# Patient Record
Sex: Female | Born: 1945
Health system: Southern US, Community
[De-identification: ages and names within clinical notes are randomized; demographics above are authoritative.]

## PROBLEM LIST (undated history)

## (undated) DIAGNOSIS — I70208 Unspecified atherosclerosis of native arteries of extremities, other extremity: Secondary | ICD-10-CM

## (undated) DIAGNOSIS — M1990 Unspecified osteoarthritis, unspecified site: Secondary | ICD-10-CM

## (undated) DIAGNOSIS — I341 Nonrheumatic mitral (valve) prolapse: Secondary | ICD-10-CM

## (undated) HISTORY — PX: COLONOSCOPY: SHX174

## (undated) HISTORY — PX: LIPOSUCTION HEAD / NECK: SUR831

## (undated) HISTORY — PX: ROTATOR CUFF REPAIR: SHX139

---

## 1998-03-03 ENCOUNTER — Other Ambulatory Visit: Admission: RE | Admit: 1998-03-03 | Discharge: 1998-03-03 | Payer: Self-pay | Admitting: Obstetrics and Gynecology

## 1999-03-03 ENCOUNTER — Other Ambulatory Visit: Admission: RE | Admit: 1999-03-03 | Discharge: 1999-03-03 | Payer: Self-pay | Admitting: Obstetrics and Gynecology

## 2000-02-24 ENCOUNTER — Encounter: Admission: RE | Admit: 2000-02-24 | Discharge: 2000-02-24 | Payer: Self-pay | Admitting: Obstetrics and Gynecology

## 2000-02-24 ENCOUNTER — Encounter: Payer: Self-pay | Admitting: Obstetrics and Gynecology

## 2000-03-10 ENCOUNTER — Other Ambulatory Visit: Admission: RE | Admit: 2000-03-10 | Discharge: 2000-03-10 | Payer: Self-pay | Admitting: Obstetrics and Gynecology

## 2000-03-11 ENCOUNTER — Other Ambulatory Visit: Admission: RE | Admit: 2000-03-11 | Discharge: 2000-03-11 | Payer: Self-pay | Admitting: Obstetrics and Gynecology

## 2001-02-15 ENCOUNTER — Ambulatory Visit (HOSPITAL_COMMUNITY): Admission: RE | Admit: 2001-02-15 | Discharge: 2001-02-15 | Payer: Self-pay | Admitting: Gastroenterology

## 2001-03-22 ENCOUNTER — Other Ambulatory Visit: Admission: RE | Admit: 2001-03-22 | Discharge: 2001-03-22 | Payer: Self-pay | Admitting: Obstetrics and Gynecology

## 2002-04-16 ENCOUNTER — Other Ambulatory Visit: Admission: RE | Admit: 2002-04-16 | Discharge: 2002-04-16 | Payer: Self-pay | Admitting: Obstetrics and Gynecology

## 2003-04-22 ENCOUNTER — Other Ambulatory Visit: Admission: RE | Admit: 2003-04-22 | Discharge: 2003-04-22 | Payer: Self-pay | Admitting: Obstetrics and Gynecology

## 2014-07-11 DIAGNOSIS — Z86010 Personal history of colonic polyps: Secondary | ICD-10-CM | POA: Diagnosis not present

## 2014-07-11 DIAGNOSIS — K5730 Diverticulosis of large intestine without perforation or abscess without bleeding: Secondary | ICD-10-CM | POA: Diagnosis not present

## 2014-07-16 DIAGNOSIS — M8580 Other specified disorders of bone density and structure, unspecified site: Secondary | ICD-10-CM | POA: Diagnosis not present

## 2014-07-16 DIAGNOSIS — Z01419 Encounter for gynecological examination (general) (routine) without abnormal findings: Secondary | ICD-10-CM | POA: Diagnosis not present

## 2014-07-16 DIAGNOSIS — Z124 Encounter for screening for malignant neoplasm of cervix: Secondary | ICD-10-CM | POA: Diagnosis not present

## 2014-07-16 DIAGNOSIS — G4700 Insomnia, unspecified: Secondary | ICD-10-CM | POA: Diagnosis not present

## 2014-07-16 DIAGNOSIS — N952 Postmenopausal atrophic vaginitis: Secondary | ICD-10-CM | POA: Diagnosis not present

## 2014-07-30 DIAGNOSIS — Z1231 Encounter for screening mammogram for malignant neoplasm of breast: Secondary | ICD-10-CM | POA: Diagnosis not present

## 2014-08-15 DIAGNOSIS — M8580 Other specified disorders of bone density and structure, unspecified site: Secondary | ICD-10-CM | POA: Diagnosis not present

## 2014-10-01 DIAGNOSIS — H01001 Unspecified blepharitis right upper eyelid: Secondary | ICD-10-CM | POA: Diagnosis not present

## 2014-10-01 DIAGNOSIS — H43813 Vitreous degeneration, bilateral: Secondary | ICD-10-CM | POA: Diagnosis not present

## 2014-10-01 DIAGNOSIS — H524 Presbyopia: Secondary | ICD-10-CM | POA: Diagnosis not present

## 2014-10-01 DIAGNOSIS — H5201 Hypermetropia, right eye: Secondary | ICD-10-CM | POA: Diagnosis not present

## 2014-12-08 DIAGNOSIS — S93402A Sprain of unspecified ligament of left ankle, initial encounter: Secondary | ICD-10-CM | POA: Diagnosis not present

## 2014-12-08 DIAGNOSIS — W19XXXS Unspecified fall, sequela: Secondary | ICD-10-CM | POA: Diagnosis not present

## 2014-12-08 DIAGNOSIS — R030 Elevated blood-pressure reading, without diagnosis of hypertension: Secondary | ICD-10-CM | POA: Diagnosis not present

## 2014-12-08 DIAGNOSIS — S0081XA Abrasion of other part of head, initial encounter: Secondary | ICD-10-CM | POA: Diagnosis not present

## 2015-01-28 DIAGNOSIS — Z23 Encounter for immunization: Secondary | ICD-10-CM | POA: Diagnosis not present

## 2015-01-28 DIAGNOSIS — I10 Essential (primary) hypertension: Secondary | ICD-10-CM | POA: Diagnosis not present

## 2015-04-17 DIAGNOSIS — L812 Freckles: Secondary | ICD-10-CM | POA: Diagnosis not present

## 2015-04-17 DIAGNOSIS — L919 Hypertrophic disorder of the skin, unspecified: Secondary | ICD-10-CM | POA: Diagnosis not present

## 2015-04-17 DIAGNOSIS — D1801 Hemangioma of skin and subcutaneous tissue: Secondary | ICD-10-CM | POA: Diagnosis not present

## 2015-07-29 DIAGNOSIS — Z124 Encounter for screening for malignant neoplasm of cervix: Secondary | ICD-10-CM | POA: Diagnosis not present

## 2015-07-29 DIAGNOSIS — Z01419 Encounter for gynecological examination (general) (routine) without abnormal findings: Secondary | ICD-10-CM | POA: Diagnosis not present

## 2015-07-29 DIAGNOSIS — Z113 Encounter for screening for infections with a predominantly sexual mode of transmission: Secondary | ICD-10-CM | POA: Diagnosis not present

## 2015-08-12 DIAGNOSIS — Z1231 Encounter for screening mammogram for malignant neoplasm of breast: Secondary | ICD-10-CM | POA: Diagnosis not present

## 2015-08-26 DIAGNOSIS — N63 Unspecified lump in breast: Secondary | ICD-10-CM | POA: Diagnosis not present

## 2015-08-26 DIAGNOSIS — R928 Other abnormal and inconclusive findings on diagnostic imaging of breast: Secondary | ICD-10-CM | POA: Diagnosis not present

## 2015-08-26 DIAGNOSIS — R922 Inconclusive mammogram: Secondary | ICD-10-CM | POA: Diagnosis not present

## 2015-11-25 DIAGNOSIS — M160 Bilateral primary osteoarthritis of hip: Secondary | ICD-10-CM | POA: Insufficient documentation

## 2015-12-26 LAB — CBC AND DIFFERENTIAL
HCT: 43 % (ref 36–46)
HEMOGLOBIN: 14.5 g/dL (ref 12.0–16.0)
Neutrophils Absolute: 45 /uL
Platelets: 328 10*3/uL (ref 150–399)
WBC: 6.3 10*3/mL

## 2015-12-26 LAB — BASIC METABOLIC PANEL
BUN: 21 mg/dL (ref 4–21)
CREATININE: 0.7 mg/dL (ref 0.5–1.1)
Glucose: 88 mg/dL
POTASSIUM: 4.8 mmol/L (ref 3.4–5.3)
SODIUM: 144 mmol/L (ref 137–147)

## 2015-12-26 LAB — HEPATIC FUNCTION PANEL
ALT: 39 U/L — AB (ref 7–35)
AST: 22 U/L (ref 13–35)
Alkaline Phosphatase: 96 U/L (ref 25–125)
Bilirubin, Total: 0.6 mg/dL

## 2015-12-26 LAB — LIPID PANEL
CHOLESTEROL: 3 mg/dL (ref 0–200)
HDL: 75 mg/dL — AB (ref 35–70)
LDL Cholesterol: 123 mg/dL
Triglycerides: 67 mg/dL (ref 40–160)

## 2015-12-31 DIAGNOSIS — Z23 Encounter for immunization: Secondary | ICD-10-CM | POA: Diagnosis not present

## 2016-02-26 DIAGNOSIS — N6489 Other specified disorders of breast: Secondary | ICD-10-CM | POA: Diagnosis not present

## 2016-03-13 ENCOUNTER — Encounter: Payer: Self-pay | Admitting: Internal Medicine

## 2016-03-13 DIAGNOSIS — M8580 Other specified disorders of bone density and structure, unspecified site: Secondary | ICD-10-CM | POA: Insufficient documentation

## 2016-03-13 DIAGNOSIS — M810 Age-related osteoporosis without current pathological fracture: Secondary | ICD-10-CM | POA: Insufficient documentation

## 2016-03-13 DIAGNOSIS — F418 Other specified anxiety disorders: Secondary | ICD-10-CM | POA: Insufficient documentation

## 2016-03-13 DIAGNOSIS — M19019 Primary osteoarthritis, unspecified shoulder: Secondary | ICD-10-CM | POA: Insufficient documentation

## 2016-03-13 DIAGNOSIS — R030 Elevated blood-pressure reading, without diagnosis of hypertension: Secondary | ICD-10-CM | POA: Insufficient documentation

## 2016-04-22 NOTE — Progress Notes (Signed)
Subjective:    Patient ID: Kelsey Dominguez, female    DOB: 1945/07/25, 70 y.o.   MRN: [id number]  HPI She is here to establish with a new pcp.    Tingling and occ numbness in left arm. It started a few months ago and it is intermittent.  She notices it mostly when she is in bed, but she thinks it occurs at other times as well.  She denies neck pain and pain in the left arm.  She denies weakness in the arm.   Her BP has been up and down in the past few years.  She has a diagnosis of htn, but is not on any medication.    Performance anxiety:  She takes propranolol on occasion for performance anxiety when she goes to a meeting.  The medication works well and she denies side effects.  She takes it rarely.   Osteopenia:  She thinks her dexa is up to date.  She is taking vitamin d daily.  She tries to get calcium in her diet.    Medications and allergies reviewed with patient and updated if appropriate.  Patient Active Problem List   Diagnosis Date Noted  . AC joint arthropathy 03/13/2016  . Osteopenia 03/13/2016  . Performance anxiety 03/13/2016  . Essential hypertension, benign 03/13/2016    No current outpatient prescriptions on file prior to visit.   No current facility-administered medications on file prior to visit.     History reviewed. No pertinent past medical history.  Past Surgical History:  Procedure Laterality Date  . LIPOSUCTION HEAD / NECK     chin and neck    Social History   Social History  . Marital status: Married    Spouse name: N/A  . Number of children: N/A  . Years of education: N/A   Social History Main Topics  . Smoking status: Former Research scientist (life sciences)  . Smokeless tobacco: Never Used  . Alcohol use Yes  . Drug use: No  . Sexual activity: Not Asked   Other Topics Concern  . None   Social History Narrative  . None    Family History  Problem Relation Age of Onset  . Cancer Mother   . Alzheimer's disease Father     Review of Systems    Constitutional: Negative for chills and fever.  Eyes: Negative for visual disturbance.  Respiratory: Negative for cough, shortness of breath and wheezing.   Cardiovascular: Positive for chest pain (episode of nausea in chest while driving - lasted seconds). Negative for palpitations and leg swelling.  Gastrointestinal: Negative for abdominal pain, blood in stool, constipation, diarrhea and nausea.       No gerd  Genitourinary: Negative for dysuria and hematuria.  Musculoskeletal: Positive for arthralgias (hips, right shoulder).  Neurological: Positive for dizziness (rare), numbness and headaches (rare). Negative for weakness and light-headedness.  Psychiatric/Behavioral: Negative for dysphoric mood. The patient is not nervous/anxious.        Objective:   Vitals:   04/23/16 1019  BP: 134/82  Pulse: 67  Resp: 16  Temp: 97.8 F (36.6 C)   Filed Weights   04/23/16 1019  Weight: 139 lb (63 kg)   Body mass index is 25.42 kg/m.   Physical Exam Constitutional: She appears well-developed and well-nourished. No distress.  HENT:  Head: Normocephalic and atraumatic.  Right Ear: External ear normal. Normal ear canal and TM Left Ear: External ear normal.  Normal ear canal and TM Mouth/Throat: Oropharynx is clear and moist.  Eyes: Conjunctivae and EOM are normal.  Neck: Neck supple. No tracheal deviation present. No thyromegaly present.  No carotid bruit  Cardiovascular: Normal rate, regular rhythm and normal heart sounds.   No murmur heard.  No edema. Pulmonary/Chest: Effort normal and breath sounds normal. No respiratory distress. She has no wheezes. She has no rales.  Breast: deferred Abdominal: Soft. She exhibits no distension. There is no tenderness.  Lymphadenopathy: She has no cervical adenopathy.  Skin: Skin is warm and dry. She is not diaphoretic.  Psychiatric: She has a normal mood and affect. Her behavior is normal.       Assessment & Plan:   See Problem List for  Assessment and Plan of chronic medical problems.

## 2016-04-23 ENCOUNTER — Encounter: Payer: Self-pay | Admitting: Internal Medicine

## 2016-04-23 ENCOUNTER — Ambulatory Visit (INDEPENDENT_AMBULATORY_CARE_PROVIDER_SITE_OTHER): Payer: Medicare Other | Admitting: Internal Medicine

## 2016-04-23 VITALS — BP 134/82 | HR 67 | Temp 97.8°F | Resp 16 | Ht 62.0 in | Wt 139.0 lb

## 2016-04-23 DIAGNOSIS — M8580 Other specified disorders of bone density and structure, unspecified site: Secondary | ICD-10-CM | POA: Diagnosis not present

## 2016-04-23 DIAGNOSIS — R202 Paresthesia of skin: Secondary | ICD-10-CM

## 2016-04-23 DIAGNOSIS — F418 Other specified anxiety disorders: Secondary | ICD-10-CM

## 2016-04-23 DIAGNOSIS — Z23 Encounter for immunization: Secondary | ICD-10-CM | POA: Diagnosis not present

## 2016-04-23 DIAGNOSIS — R200 Anesthesia of skin: Secondary | ICD-10-CM

## 2016-04-23 DIAGNOSIS — I10 Essential (primary) hypertension: Secondary | ICD-10-CM | POA: Diagnosis not present

## 2016-04-23 DIAGNOSIS — R110 Nausea: Secondary | ICD-10-CM

## 2016-04-23 MED ORDER — PROPRANOLOL HCL 20 MG PO TABS: 20.0000 mg | ORAL_TABLET | Freq: Two times a day (BID) | ORAL | 3 refills | Status: DC | PRN

## 2016-04-23 NOTE — Progress Notes (Signed)
Pre visit review using our clinic review tool, if applicable. No additional management support is needed unless otherwise documented below in the visit note. 

## 2016-04-23 NOTE — Patient Instructions (Addendum)
Monitor your BP at home - call or return if it is elevated.  If your tingling/numbness your left arm worsens please call or return.   If you experience any other chest symptoms please call.   All other Health Maintenance issues reviewed.   All recommended immunizations and age-appropriate screenings are up-to-date or discussed.  Pneumovax immunization administered today.   Medications reviewed and updated.   No changes recommended at this time.   Please followup once a year

## 2016-04-25 ENCOUNTER — Encounter: Payer: Self-pay | Admitting: Internal Medicine

## 2016-04-25 DIAGNOSIS — R110 Nausea: Secondary | ICD-10-CM | POA: Insufficient documentation

## 2016-04-25 DIAGNOSIS — R202 Paresthesia of skin: Secondary | ICD-10-CM

## 2016-04-25 DIAGNOSIS — R200 Anesthesia of skin: Secondary | ICD-10-CM | POA: Insufficient documentation

## 2016-04-25 NOTE — Assessment & Plan Note (Signed)
She had an episode of a "nausea" like feeling in her chest while driving last week No discomfort of pain No discomfort or pain with activity/exercise The sensation lasted seconds, no recurrence Hard to say what it  Was - does not sound cardiac in nature She agrees to return if she has any other concerning symptoms

## 2016-04-25 NOTE — Assessment & Plan Note (Signed)
?   dexa up to date - will revisit at her next visit Continue vitamin D If not able to get enough calcium in diet - supplement Regular exercise stressed

## 2016-04-25 NOTE — Assessment & Plan Note (Signed)
Continue propranolol as needed.

## 2016-04-25 NOTE — Assessment & Plan Note (Signed)
Intermittent Most noticeable when laying down ? Cervical radiculopathy No neck pain, weakness or pain Discussed further evaluation at this time vs observation -- she will just monitor for now and if worsens she will call/return

## 2016-04-25 NOTE — Assessment & Plan Note (Signed)
Controlled without medication at this time Advised her to monitor BP at home regularly Low sodium diet Regular exercise Follow up if BP is elevated at home Deferred blood work to next visit

## 2016-04-29 ENCOUNTER — Encounter: Payer: Self-pay | Admitting: Internal Medicine

## 2016-06-25 DIAGNOSIS — H524 Presbyopia: Secondary | ICD-10-CM | POA: Diagnosis not present

## 2016-06-25 DIAGNOSIS — H01004 Unspecified blepharitis left upper eyelid: Secondary | ICD-10-CM | POA: Diagnosis not present

## 2016-06-25 DIAGNOSIS — H01001 Unspecified blepharitis right upper eyelid: Secondary | ICD-10-CM | POA: Diagnosis not present

## 2016-06-25 DIAGNOSIS — H2513 Age-related nuclear cataract, bilateral: Secondary | ICD-10-CM | POA: Diagnosis not present

## 2016-08-03 DIAGNOSIS — Z124 Encounter for screening for malignant neoplasm of cervix: Secondary | ICD-10-CM | POA: Diagnosis not present

## 2016-08-17 DIAGNOSIS — M8589 Other specified disorders of bone density and structure, multiple sites: Secondary | ICD-10-CM | POA: Diagnosis not present

## 2016-08-17 DIAGNOSIS — Z1231 Encounter for screening mammogram for malignant neoplasm of breast: Secondary | ICD-10-CM | POA: Diagnosis not present

## 2016-09-14 DIAGNOSIS — D1800 Hemangioma unspecified site: Secondary | ICD-10-CM | POA: Diagnosis not present

## 2016-09-14 DIAGNOSIS — L570 Actinic keratosis: Secondary | ICD-10-CM | POA: Diagnosis not present

## 2016-09-14 DIAGNOSIS — L821 Other seborrheic keratosis: Secondary | ICD-10-CM | POA: Diagnosis not present

## 2016-09-14 DIAGNOSIS — L814 Other melanin hyperpigmentation: Secondary | ICD-10-CM | POA: Diagnosis not present

## 2016-09-14 DIAGNOSIS — D225 Melanocytic nevi of trunk: Secondary | ICD-10-CM | POA: Diagnosis not present

## 2017-02-17 DIAGNOSIS — Z23 Encounter for immunization: Secondary | ICD-10-CM | POA: Diagnosis not present

## 2017-06-05 NOTE — Patient Instructions (Addendum)
Kelsey Dominguez , Thank you for taking time to come for your Medicare Wellness Visit. I appreciate your ongoing commitment to your health goals. Please review the following plan we discussed and let me know if I can assist you in the future.   These are the goals we discussed: Goals    Monitor your BP - it should be less than 140/90      This is a list of the screening recommended for you and due dates:  Health Maintenance  Topic Date Due  .  Hepatitis C: One time screening is recommended by Center for Disease Control  (CDC) for  adults born from [birth year] through 1963.   [birth year]  . Mammogram  06/28/1997  . Colon Cancer Screening  06/28/1997  . DEXA scan (bone density measurement)  06/28/2012  . Tetanus Vaccine  07/31/2017  . Flu Shot  Completed  . Pneumonia vaccines  Completed      Test(s) ordered today. Your results will be released to [facility] (or called to you) after review, usually within 72hours after test completion. If any changes need to be made, you will be notified at that same time.  All other Health Maintenance issues reviewed.   All recommended immunizations and age-appropriate screenings are up-to-date or discussed.  No immunizations administered today.   Medications reviewed and updated.  No changes recommended at this time.   Please followup in one year    Health Maintenance, Female Adopting a healthy lifestyle and getting preventive care can go a long way to promote health and wellness. Talk with your health care provider about what schedule of regular examinations is right for you. This is a good chance for you to check in with your provider about disease prevention and staying healthy. In between checkups, there are plenty of things you can do on your own. Experts have done a lot of research about which lifestyle changes and preventive measures are most likely to keep you healthy. Ask your health care provider for more information. Weight and diet Eat a healthy  diet  Be sure to include plenty of vegetables, fruits, low-fat dairy products, and lean protein.  Do not eat a lot of foods high in solid fats, added sugars, or salt.  Get regular exercise. This is one of the most important things you can do for your health. ? Most adults should exercise for at least 150 minutes each week. The exercise should increase your heart rate and make you sweat (moderate-intensity exercise). ? Most adults should also do strengthening exercises at least twice a week. This is in addition to the moderate-intensity exercise.  Maintain a healthy weight  Body mass index (BMI) is a measurement that can be used to identify possible weight problems. It estimates body fat based on height and weight. Your health care provider can help determine your BMI and help you achieve or maintain a healthy weight.  For females 64 years of age and older: ? A BMI below 18.5 is considered underweight. ? A BMI of 18.5 to 24.9 is normal. ? A BMI of 25 to 29.9 is considered overweight. ? A BMI of 30 and above is considered obese.  Watch levels of cholesterol and blood lipids  You should start having your blood tested for lipids and cholesterol at 70 years of age, then have this test every 5 years.  You may need to have your cholesterol levels checked more often if: ? Your lipid or cholesterol levels are high. ? You are older  than 71 years of age. ? You are at high risk for heart disease.  Cancer screening Lung Cancer  Lung cancer screening is recommended for adults 72-55 years old who are at high risk for lung cancer because of a history of smoking.  A yearly low-dose CT scan of the lungs is recommended for people who: ? Currently smoke. ? Have quit within the past 15 years. ? Have at least a 30-pack-year history of smoking. A pack year is smoking an average of one pack of cigarettes a day for 1 year.  Yearly screening should continue until it has been 15 years since you  quit.  Yearly screening should stop if you develop a health problem that would prevent you from having lung cancer treatment.  Breast Cancer  Practice breast self-awareness. This means understanding how your breasts normally appear and feel.  It also means doing regular breast self-exams. Let your health care provider know about any changes, no matter how small.  If you are in your 20s or 30s, you should have a clinical breast exam (CBE) by a health care provider every 1-3 years as part of a regular health exam.  If you are 55 or older, have a CBE every year. Also consider having a breast X-ray (mammogram) every year.  If you have a family history of breast cancer, talk to your health care provider about genetic screening.  If you are at high risk for breast cancer, talk to your health care provider about having an MRI and a mammogram every year.  Breast cancer gene (BRCA) assessment is recommended for women who have family members with BRCA-related cancers. BRCA-related cancers include: ? Breast. ? Ovarian. ? Tubal. ? Peritoneal cancers.  Results of the assessment will determine the need for genetic counseling and BRCA1 and BRCA2 testing.  Cervical Cancer Your health care provider may recommend that you be screened regularly for cancer of the pelvic organs (ovaries, uterus, and vagina). This screening involves a pelvic examination, including checking for microscopic changes to the surface of your cervix (Pap test). You may be encouraged to have this screening done every 3 years, beginning at age 34.  For women ages 59-65, health care providers may recommend pelvic exams and Pap testing every 3 years, or they may recommend the Pap and pelvic exam, combined with testing for human papilloma virus (HPV), every 5 years. Some types of HPV increase your risk of cervical cancer. Testing for HPV may also be done on women of any age with unclear Pap test results.  Other health care providers  may not recommend any screening for nonpregnant women who are considered low risk for pelvic cancer and who do not have symptoms. Ask your health care provider if a screening pelvic exam is right for you.  If you have had past treatment for cervical cancer or a condition that could lead to cancer, you need Pap tests and screening for cancer for at least 20 years after your treatment. If Pap tests have been discontinued, your risk factors (such as having a new sexual partner) need to be reassessed to determine if screening should resume. Some women have medical problems that increase the chance of getting cervical cancer. In these cases, your health care provider may recommend more frequent screening and Pap tests.  Colorectal Cancer  This type of cancer can be detected and often prevented.  Routine colorectal cancer screening usually begins at 70 years of age and continues through 70 years of age.  Your  health care provider may recommend screening at an earlier age if you have risk factors for colon cancer.  Your health care provider may also recommend using home test kits to check for hidden blood in the stool.  A small camera at the end of a tube can be used to examine your colon directly (sigmoidoscopy or colonoscopy). This is done to check for the earliest forms of colorectal cancer.  Routine screening usually begins at age 73.  Direct examination of the colon should be repeated every 5-10 years through 70 years of age. However, you may need to be screened more often if early forms of precancerous polyps or small growths are found.  Skin Cancer  Check your skin from head to toe regularly.  Tell your health care provider about any new moles or changes in moles, especially if there is a change in a mole's shape or color.  Also tell your health care provider if you have a mole that is larger than the size of a pencil eraser.  Always use sunscreen. Apply sunscreen liberally and repeatedly  throughout the day.  Protect yourself by wearing long sleeves, pants, a wide-brimmed hat, and sunglasses whenever you are outside.  Heart disease, diabetes, and high blood pressure  High blood pressure causes heart disease and increases the risk of stroke. High blood pressure is more likely to develop in: ? People who have blood pressure in the high end of the normal range (130-139/85-89 mm Hg). ? People who are overweight or obese. ? People who are African American.  If you are 44-39 years of age, have your blood pressure checked every 3-5 years. If you are 44 years of age or older, have your blood pressure checked every year. You should have your blood pressure measured twice-once when you are at a hospital or clinic, and once when you are not at a hospital or clinic. Record the average of the two measurements. To check your blood pressure when you are not at a hospital or clinic, you can use: ? An automated blood pressure machine at a pharmacy. ? A home blood pressure monitor.  If you are between 31 years and 80 years old, ask your health care provider if you should take aspirin to prevent strokes.  Have regular diabetes screenings. This involves taking a blood sample to check your fasting blood sugar level. ? If you are at a normal weight and have a low risk for diabetes, have this test once every three years after 70 years of age. ? If you are overweight and have a high risk for diabetes, consider being tested at a younger age or more often. Preventing infection Hepatitis B  If you have a higher risk for hepatitis B, you should be screened for this virus. You are considered at high risk for hepatitis B if: ? You were born in a country where hepatitis B is common. Ask your health care provider which countries are considered high risk. ? Your parents were born in a high-risk country, and you have not been immunized against hepatitis B (hepatitis B vaccine). ? You have HIV or AIDS. ? You  use needles to inject street drugs. ? You live with someone who has hepatitis B. ? You have had sex with someone who has hepatitis B. ? You get hemodialysis treatment. ? You take certain medicines for conditions, including cancer, organ transplantation, and autoimmune conditions.  Hepatitis C  Blood testing is recommended for: ? Everyone born from [birth year] through 1963. ?  Anyone with known risk factors for hepatitis C.  Sexually transmitted infections (STIs)  You should be screened for sexually transmitted infections (STIs) including gonorrhea and chlamydia if: ? You are sexually active and are younger than 70 years of age. ? You are older than 70 years of age and your health care provider tells you that you are at risk for this type of infection. ? Your sexual activity has changed since you were last screened and you are at an increased risk for chlamydia or gonorrhea. Ask your health care provider if you are at risk.  If you do not have HIV, but are at risk, it may be recommended that you take a prescription medicine daily to prevent HIV infection. This is called pre-exposure prophylaxis (PrEP). You are considered at risk if: ? You are sexually active and do not regularly use condoms or know the HIV status of your partner(s). ? You take drugs by injection. ? You are sexually active with a partner who has HIV.  Talk with your health care provider about whether you are at high risk of being infected with HIV. If you choose to begin PrEP, you should first be tested for HIV. You should then be tested every 3 months for as long as you are taking PrEP. Pregnancy  If you are premenopausal and you may become pregnant, ask your health care provider about preconception counseling.  If you may become pregnant, take 400 to 800 micrograms (mcg) of folic acid every day.  If you want to prevent pregnancy, talk to your health care provider about birth control (contraception). Osteoporosis and  menopause  Osteoporosis is a disease in which the bones lose minerals and strength with aging. This can result in serious bone fractures. Your risk for osteoporosis can be identified using a bone density scan.  If you are 58 years of age or older, or if you are at risk for osteoporosis and fractures, ask your health care provider if you should be screened.  Ask your health care provider whether you should take a calcium or vitamin D supplement to lower your risk for osteoporosis.  Menopause may have certain physical symptoms and risks.  Hormone replacement therapy may reduce some of these symptoms and risks. Talk to your health care provider about whether hormone replacement therapy is right for you. Follow these instructions at home:  Schedule regular health, dental, and eye exams.  Stay current with your immunizations.  Do not use any tobacco products including cigarettes, chewing tobacco, or electronic cigarettes.  If you are pregnant, do not drink alcohol.  If you are breastfeeding, limit how much and how often you drink alcohol.  Limit alcohol intake to no more than 1 drink per day for nonpregnant women. One drink equals 12 ounces of beer, 5 ounces of wine, or 1 ounces of hard liquor.  Do not use street drugs.  Do not share needles.  Ask your health care provider for help if you need support or information about quitting drugs.  Tell your health care provider if you often feel depressed.  Tell your health care provider if you have ever been abused or do not feel safe at home. This information is not intended to replace advice given to you by your health care provider. Make sure you discuss any questions you have with your health care provider. Document Released: 11/09/2010 Document Revised: 10/02/2015 Document Reviewed: 01/28/2015 Elsevier Interactive Patient Education  [employer].

## 2017-06-05 NOTE — Progress Notes (Signed)
Subjective:    Patient ID: Kelsey Dominguez, female    DOB: 07-09-1945, 72 y.o.   MRN: [id number]  HPI Here for medicare wellness exam and follow up of her chronic medical problems.     I have personally reviewed and have noted 1.The patient's medical and social history 2.Their use of alcohol, tobacco or illicit drugs 3.Their current medications and supplements 4.The patient's functional ability including ADL's, fall risks, home                 safety risk and hearing or visual impairment. 5.Diet and physical activities 6.Evidence for depression or mood disorders 7.Care team reviewed  -  [facility] - Dr Earlean Shawl, Gyn,   [place] hypertension: She is not on any medication.  She is compliant with a low sodium diet.  She denies chest pain, palpitations, edema, shortness of breath and regular headaches. She is exercising regularly.  She does not monitor her blood pressure at home now, but has checked it in the past and it was always good.     Numbness in hands:  She has numbness in her hands during the night.  If she moves it goes away.  She denies any numbness/weakness or pain during the day.    Performance anxiety:  She takes propranolol as needed for anxiety when presenting.  It works well.  She rarely takes it.   Are there smokers in your home (other than you)? No  Risk Factors Exercise:   Walking, working with a trainer Dietary issues discussed:  Well balanced, mostly at home  Vitamin and supplement use: taking vitamin irregularly  Opiod use:  no Side effects from medication: n/a Does medications benefits outweigh risks/side effects: n/a  Cardiac risk factors: advanced age, hypertension  Depression Screen  Have you felt down, depressed or hopeless? No  Have you felt little interest or pleasure in doing things?  No  Activities of Daily Living In your present state of health, do you have any difficulty performing the  following activities?:  Driving? No Managing money?  No Feeding yourself? No Getting from bed to chair? No Climbing a flight of stairs? No Preparing food and eating?: No Bathing or showering? No Getting dressed: No Getting to/using the toilet? No Moving around from place to place: No In the past year have you fallen or had a near fall?: No   Are you sexually active?  No  Do you have more than one partner?  N/A  Hearing Difficulties: yes, mild Do you often ask people to speak up or repeat themselves? No Do you experience ringing or noises in your ears? No Do you have difficulty understanding soft or whispered voices? yes Vision:              Any change in vision: no             Up to date with eye exam: yes  Memory:  Do you feel that you have a problem with memory? Yes, recall, but also some short time memory concerns.  She has a family history and worries about memory loss  Do you often misplace items? No  Do you feel safe at home?  Yes  Cognitive Testing  Alert, Orientated? Yes  Normal Appearance? Yes  Recall of three objects?  Yes  Can perform simple calculations? Yes  Displays appropriate judgment? Yes  Can read the correct time from a watch face? Yes   Advanced Directives have been discussed with the patient? Yes -  in place      Medications and allergies reviewed with patient and updated if appropriate.  Patient Active Problem List   Diagnosis Date Noted  . Numbness and tingling in left arm 04/25/2016  . Nausea 04/25/2016  . AC joint arthropathy 03/13/2016  . Osteopenia 03/13/2016  . Performance anxiety 03/13/2016  . Essential hypertension, benign 03/13/2016  . Osteoarthritis, hip, bilateral 11/25/2015    Current Outpatient Medications on File Prior to Visit  Medication Sig Dispense Refill  . aspirin 81 MG tablet Take 81 mg by mouth daily.    . Cholecalciferol (VITAMIN D3) 2000 units TABS Take by mouth daily.    . Melatonin 5 MG TABS Take by mouth daily.     . Omega-3 Fatty Acids (FISH OIL) 1200 MG CAPS Take 2,400 mg by mouth daily.    . propranolol (INDERAL) 20 MG tablet Take 1 tablet (20 mg total) by mouth 2 (two) times daily as needed (performance anxiety). 30 tablet 3  . TURMERIC PO Take 1,000 mg by mouth daily.     No current facility-administered medications on file prior to visit.     No past medical history on file.  Past Surgical History:  Procedure Laterality Date  . LIPOSUCTION HEAD / NECK     chin and neck    Social History   Socioeconomic History  . Marital status: Married    Spouse name: None  . Number of children: None  . Years of education: None  . Highest education level: None  Social Needs  . Financial resource strain: None  . Food insecurity - worry: None  . Food insecurity - inability: None  . Transportation needs - medical: None  . Transportation needs - non-medical: None  Occupational History  . None  Tobacco Use  . Smoking status: Former Research scientist (life sciences)  . Smokeless tobacco: Never Used  Substance and Sexual Activity  . Alcohol use: Yes    Comment: daily - 1-2 wine per day  . Drug use: No  . Sexual activity: None  Other Topics Concern  . None  Social History Narrative   Walks 2-3 times a week    Family History  Problem Relation Age of Onset  . Lung cancer Mother   . Alzheimer's disease Father   . Heart disease Father   . Heart disease Brother        s/p stent  . Stroke Maternal Grandfather     Review of Systems  Constitutional: Negative for chills and fever.  Eyes: Negative for visual disturbance.  Respiratory: Negative for cough, shortness of breath and wheezing.   Cardiovascular: Negative for chest pain, palpitations and leg swelling.  Gastrointestinal: Negative for abdominal pain, blood in stool, constipation, diarrhea and nausea.       Rare gerd  Genitourinary: Negative for dysuria and hematuria.  Musculoskeletal: Positive for arthralgias (hip, shoulder).  Neurological: Negative for  light-headedness and headaches.  Psychiatric/Behavioral: Negative for dysphoric mood. The patient is not nervous/anxious.        Objective:   Vitals:   06/06/17 0912  BP: (!) 152/94  Pulse: 66  Resp: 16  Temp: 98.2 F (36.8 C)  SpO2: 98%   Filed Weights   06/06/17 0912  Weight: 136 lb (61.7 kg)   Body mass index is 24.87 kg/m.  Wt Readings from Last 3 Encounters:  06/06/17 136 lb (61.7 kg)  04/23/16 139 lb (63 kg)     Physical Exam Constitutional: She appears well-developed and well-nourished. No distress.  HENT:  Head: Normocephalic and atraumatic.  Right Ear: External ear normal. Normal ear canal and TM Left Ear: External ear normal.  Normal ear canal and TM Mouth/Throat: Oropharynx is clear and moist.  Eyes: Conjunctivae and EOM are normal.  Neck: Neck supple. No tracheal deviation present. No thyromegaly present.  No carotid bruit  Cardiovascular: Normal rate, regular rhythm and normal heart sounds.   No murmur heard.  No edema. Pulmonary/Chest: Effort normal and breath sounds normal. No respiratory distress. She has no wheezes. She has no rales.  Breast: deferred to Gyn Abdominal: Soft. She exhibits no distension. There is no tenderness.  Lymphadenopathy: She has no cervical adenopathy.  Skin: Skin is warm and dry. She is not diaphoretic.  Psychiatric: She has a normal mood and affect. Her behavior is normal.        Assessment & Plan:   Wellness Exam: Immunizations  Had shingrix, others up to date Colonoscopy  Up to date  Mammogram  Up to date - next one due soon - will have report sent - [facility]   Dexa  Up to date - done via gyn Gyn  Up to date  Eye exam   Up to date  Hearing loss  Minimal - not a concern at this point Memory concerns/difficulties  Some concern with short term memory and recall - will check tsh, b12  -- consider a referral to neurology Independent of ADLs   Fully   Stressed the importance of regular exercise  Routine blood work  reviewed from the summer - done elsewhere - normal  Patient received copy of preventative screening tests/immunizations recommended for the next 5-10 years.   See Problem List for Assessment and Plan of chronic medical problems.    FU annually

## 2017-06-06 ENCOUNTER — Encounter: Payer: Self-pay | Admitting: Internal Medicine

## 2017-06-06 ENCOUNTER — Ambulatory Visit (INDEPENDENT_AMBULATORY_CARE_PROVIDER_SITE_OTHER): Payer: Medicare Other | Admitting: Internal Medicine

## 2017-06-06 ENCOUNTER — Other Ambulatory Visit (INDEPENDENT_AMBULATORY_CARE_PROVIDER_SITE_OTHER): Payer: Medicare Other

## 2017-06-06 VITALS — BP 152/94 | HR 66 | Temp 98.2°F | Resp 16 | Ht 62.0 in | Wt 136.0 lb

## 2017-06-06 DIAGNOSIS — R030 Elevated blood-pressure reading, without diagnosis of hypertension: Secondary | ICD-10-CM

## 2017-06-06 DIAGNOSIS — M8580 Other specified disorders of bone density and structure, unspecified site: Secondary | ICD-10-CM | POA: Diagnosis not present

## 2017-06-06 DIAGNOSIS — Z1159 Encounter for screening for other viral diseases: Secondary | ICD-10-CM

## 2017-06-06 DIAGNOSIS — Z0000 Encounter for general adult medical examination without abnormal findings: Secondary | ICD-10-CM | POA: Diagnosis not present

## 2017-06-06 DIAGNOSIS — F418 Other specified anxiety disorders: Secondary | ICD-10-CM

## 2017-06-06 DIAGNOSIS — R413 Other amnesia: Secondary | ICD-10-CM | POA: Diagnosis not present

## 2017-06-06 LAB — TSH: TSH: 1.61 u[IU]/mL (ref 0.35–4.50)

## 2017-06-06 LAB — VITAMIN B12: VITAMIN B 12: 310 pg/mL (ref 211–911)

## 2017-06-06 NOTE — Assessment & Plan Note (Signed)
Some concerns over memory - she has family history ? Normal for aging or true memory difficulties Check tsh, b12 Stressed mental and physical exercise, healthy lifestyle Discussed referral to neuro-psych for evaluation of memory - she will think about it and let me know if she wants the referral

## 2017-06-06 NOTE — Assessment & Plan Note (Signed)
Takes propranolol prn Will continue

## 2017-06-06 NOTE — Assessment & Plan Note (Signed)
Has white coat htn At home BP has been controlled - has not checked recently --- advised checking it regularly for the next two weeks and then intermittently but needs to always be checking Continue healthy lifestyle No medication at this time

## 2017-06-06 NOTE — Assessment & Plan Note (Signed)
dexa done by gyn Taking vitamin d

## 2017-06-07 ENCOUNTER — Encounter: Payer: Self-pay | Admitting: Internal Medicine

## 2017-06-07 LAB — HEPATITIS C ANTIBODY
HEP C AB: NONREACTIVE
SIGNAL TO CUT-OFF: 0.01 (ref <1.00)

## 2017-08-04 DIAGNOSIS — Z01419 Encounter for gynecological examination (general) (routine) without abnormal findings: Secondary | ICD-10-CM | POA: Diagnosis not present

## 2017-08-04 DIAGNOSIS — Z124 Encounter for screening for malignant neoplasm of cervix: Secondary | ICD-10-CM | POA: Diagnosis not present

## 2017-08-16 DIAGNOSIS — L814 Other melanin hyperpigmentation: Secondary | ICD-10-CM | POA: Diagnosis not present

## 2017-08-16 DIAGNOSIS — L821 Other seborrheic keratosis: Secondary | ICD-10-CM | POA: Diagnosis not present

## 2017-08-16 DIAGNOSIS — D229 Melanocytic nevi, unspecified: Secondary | ICD-10-CM | POA: Diagnosis not present

## 2017-08-16 DIAGNOSIS — D1801 Hemangioma of skin and subcutaneous tissue: Secondary | ICD-10-CM | POA: Diagnosis not present

## 2017-08-16 DIAGNOSIS — L570 Actinic keratosis: Secondary | ICD-10-CM | POA: Diagnosis not present

## 2017-08-23 DIAGNOSIS — Z1231 Encounter for screening mammogram for malignant neoplasm of breast: Secondary | ICD-10-CM | POA: Diagnosis not present

## 2017-08-23 LAB — HM MAMMOGRAPHY

## 2017-10-04 DIAGNOSIS — H2513 Age-related nuclear cataract, bilateral: Secondary | ICD-10-CM | POA: Diagnosis not present

## 2017-10-04 DIAGNOSIS — H5203 Hypermetropia, bilateral: Secondary | ICD-10-CM | POA: Diagnosis not present

## 2017-10-04 DIAGNOSIS — H524 Presbyopia: Secondary | ICD-10-CM | POA: Diagnosis not present

## 2017-12-14 ENCOUNTER — Encounter: Payer: Self-pay | Admitting: Internal Medicine

## 2018-02-06 DIAGNOSIS — Z23 Encounter for immunization: Secondary | ICD-10-CM | POA: Diagnosis not present

## 2018-05-26 DIAGNOSIS — M25511 Pain in right shoulder: Secondary | ICD-10-CM | POA: Diagnosis not present

## 2018-05-26 DIAGNOSIS — M7541 Impingement syndrome of right shoulder: Secondary | ICD-10-CM | POA: Diagnosis not present

## 2018-06-09 DIAGNOSIS — M25511 Pain in right shoulder: Secondary | ICD-10-CM | POA: Diagnosis not present

## 2018-06-14 DIAGNOSIS — M25511 Pain in right shoulder: Secondary | ICD-10-CM | POA: Diagnosis not present

## 2018-06-16 DIAGNOSIS — M25511 Pain in right shoulder: Secondary | ICD-10-CM | POA: Diagnosis not present

## 2018-06-19 DIAGNOSIS — M25511 Pain in right shoulder: Secondary | ICD-10-CM | POA: Diagnosis not present

## 2018-06-19 NOTE — Progress Notes (Signed)
Subjective:    Patient ID: Kelsey Dominguez, female    DOB: 1946/02/06, 73 y.o.   MRN: [id number]  HPI Here for medicare wellness exam and follow up of her chronic medical problems.   I have personally reviewed and have noted 1.The patient's medical and social history 2.Their use of alcohol, tobacco or illicit drugs 3.Their current medications and supplements 4.The patient's functional ability including ADL's, fall risks, home                 safety risk and hearing or visual impairment. 5.Diet and physical activities 6.Evidence for depression or mood disorders 7.Care team reviewed  -  GI - Dr Earlean Shawl, Gyn    Anxiety: She has had intermittent anxiety in the past and was taking propranolol as needed mostly for performance anxiety, but has not taken this.  She does not feel it is necessary.   White coat htn:  She monitors her BP at home and it is well controlled at home.  Her BP is good here.  Overall she eats healthy and denies a diet high in sodium.  She is not currently exercising regularly.  Osteopenia: She is unsure when her last bone density was-most likely this was done by her gynecologist.  She is not currently exercising regularly.  She was taking vitamin D daily, but stopped it because her husband read something about it not being safe.  She is not currently taking any calcium.   Are there smokers in your home (other than you)? No  Risk Factors Exercise:  None regularly Dietary issues discussed:   Well balanced, fruits, veges  Vitamin and supplement use:  None currently - discussed   Opiod use:  n/a Side effects from medication: Does medications benefits outweigh risks/side effects:   Cardiac risk factors: advanced age  Depression Screen  Have you felt down, depressed or hopeless? No  Have you felt little interest or pleasure in doing things?  No  Activities of Daily Living In your present state of  health, do you have any difficulty performing the following activities?:  Driving? No Managing money?  No Feeding yourself? No Getting from bed to chair? No Climbing a flight of stairs? No Preparing food and eating?: No Bathing or showering? No Getting dressed: No Getting to/using the toilet? No Moving around from place to place: No In the past year have you fallen or had a near fall?: yes, trip   Do you have more than one partner?  No  Hearing Difficulties:  Do you often ask people to speak up or repeat themselves? No Do you experience ringing or noises in your ears? No Do you have difficulty understanding soft or whispered voices? yes Vision:              Any change in vision: no             Up to date with eye exam:   yes  Memory:  Do you feel that you have a problem with memory? No  Do you often misplace items? Not above normal  Does not get lost driving, but has to think about where is she  going.   Do you feel safe at home?  Yes  Cognitive Testing  Alert, Orientated? Yes  Normal Appearance? Yes  Recall of three objects?  Yes  Can perform simple calculations? Yes  Displays appropriate judgment? Yes  Can read the correct time from a watch face? Yes   Advanced Directives have been  discussed with the patient? Yes, in place   Medications and allergies reviewed with patient and updated if appropriate.  Patient Active Problem List   Diagnosis Date Noted  . Memory difficulties 06/06/2017  . Numbness and tingling in left arm 04/25/2016  . AC joint arthropathy 03/13/2016  . Osteopenia 03/13/2016  . Performance anxiety 03/13/2016  . White coat syndrome without diagnosis of hypertension 03/13/2016  . Osteoarthritis, hip, bilateral 11/25/2015    Current Outpatient Medications on File Prior to Visit  Medication Sig Dispense Refill  . Cholecalciferol (VITAMIN D3) 2000 units TABS Take by mouth daily.     No current facility-administered medications on file prior to  visit.     No past medical history on file.  Past Surgical History:  Procedure Laterality Date  . LIPOSUCTION HEAD / NECK     chin and neck    Social History   Socioeconomic History  . Marital status: Married    Spouse name: Not on file  . Number of children: Not on file  . Years of education: Not on file  . Highest education level: Not on file  Occupational History  . Not on file  Social Needs  . Financial resource strain: Not on file  . Food insecurity:    Worry: Not on file    Inability: Not on file  . Transportation needs:    Medical: Not on file    Non-medical: Not on file  Tobacco Use  . Smoking status: Former Research scientist (life sciences)  . Smokeless tobacco: Never Used  Substance and Sexual Activity  . Alcohol use: Yes    Comment: weekends only  . Drug use: No  . Sexual activity: Not on file  Lifestyle  . Physical activity:    Days per week: Not on file    Minutes per session: Not on file  . Stress: Not on file  Relationships  . Social connections:    Talks on phone: Not on file    Gets together: Not on file    Attends religious service: Not on file    Active member of club or organization: Not on file    Attends meetings of clubs or organizations: Not on file    Relationship status: Not on file  Other Topics Concern  . Not on file  Social History Narrative   Walks 2-3 times a week    Family History  Problem Relation Age of Onset  . Lung cancer Mother   . Alzheimer's disease Father   . Heart disease Father   . Heart disease Brother        s/p stent  . Stroke Maternal Grandfather     Review of Systems  Constitutional: Negative for chills and fever.  HENT: Positive for hearing loss (minimal). Negative for tinnitus.   Eyes: Negative for visual disturbance.  Respiratory: Negative for cough, shortness of breath and wheezing.   Cardiovascular: Negative for chest pain, palpitations and leg swelling.  Neurological: Negative for light-headedness and headaches.    Psychiatric/Behavioral: Negative for dysphoric mood. The patient is not nervous/anxious.        Objective:   Vitals:   06/20/18 1047  BP: 124/82  Pulse: 63  Temp: 98.3 F (36.8 C)  SpO2: 98%   BP Readings from Last 3 Encounters:  06/20/18 124/82  06/06/17 (!) 152/94  04/23/16 134/82   Wt Readings from Last 3 Encounters:  06/20/18 135 lb (61.2 kg)  06/06/17 136 lb (61.7 kg)  04/23/16 139 lb (63 kg)  Body mass index is 24.69 kg/m.   Physical Exam    Constitutional: Appears well-developed and well-nourished. No distress.  HENT:  Head: Normocephalic and atraumatic.  Neck: Neck supple. No tracheal deviation present. No thyromegaly present.  No cervical lymphadenopathy Cardiovascular: Normal rate, regular rhythm and normal heart sounds.   No murmur heard. left carotid bruit .  No edema Pulmonary/Chest: Effort normal and breath sounds normal. No respiratory distress. No has no wheezes. No rales. Abdomen: Soft, nontender, nondistended Skin: Skin is warm and dry. Not diaphoretic.  Psychiatric: Normal mood and affect. Behavior is normal.      Assessment & Plan:   Wellness Exam: Immunizations   Up to date except tdap Colonoscopy   Up to date  Mammogram due - goes to [facility]-we will get report Dexa  ? Up to date - she will check her Gyn Eye exam   Up to date  Hearing loss  Minimal if any-she is not concerned Memory concerns/difficulties-she does worry about memory issues because her father had Alzheimer's, but feels her memory is likely normal.  No concerning symptoms-we will just monitor annually Independent of ADLs  Fully independent Stressed the importance of regular exercise   Patient received copy of preventative screening tests/immunizations recommended for the next 5-10 years.    See Problem List for Assessment and Plan of chronic medical problems.

## 2018-06-20 ENCOUNTER — Ambulatory Visit (INDEPENDENT_AMBULATORY_CARE_PROVIDER_SITE_OTHER): Payer: Medicare Other | Admitting: Internal Medicine

## 2018-06-20 ENCOUNTER — Encounter: Payer: Self-pay | Admitting: Internal Medicine

## 2018-06-20 ENCOUNTER — Other Ambulatory Visit (INDEPENDENT_AMBULATORY_CARE_PROVIDER_SITE_OTHER): Payer: Medicare Other

## 2018-06-20 VITALS — BP 124/82 | HR 63 | Temp 98.3°F | Ht 62.0 in | Wt 135.0 lb

## 2018-06-20 DIAGNOSIS — R0989 Other specified symptoms and signs involving the circulatory and respiratory systems: Secondary | ICD-10-CM | POA: Diagnosis not present

## 2018-06-20 DIAGNOSIS — M8580 Other specified disorders of bone density and structure, unspecified site: Secondary | ICD-10-CM | POA: Diagnosis not present

## 2018-06-20 DIAGNOSIS — E782 Mixed hyperlipidemia: Secondary | ICD-10-CM | POA: Diagnosis not present

## 2018-06-20 DIAGNOSIS — F418 Other specified anxiety disorders: Secondary | ICD-10-CM

## 2018-06-20 DIAGNOSIS — Z0000 Encounter for general adult medical examination without abnormal findings: Secondary | ICD-10-CM | POA: Diagnosis not present

## 2018-06-20 DIAGNOSIS — R030 Elevated blood-pressure reading, without diagnosis of hypertension: Secondary | ICD-10-CM | POA: Diagnosis not present

## 2018-06-20 DIAGNOSIS — E785 Hyperlipidemia, unspecified: Secondary | ICD-10-CM | POA: Insufficient documentation

## 2018-06-20 LAB — CBC WITH DIFFERENTIAL/PLATELET
Basophils Absolute: 0 10*3/uL (ref 0.0–0.1)
Basophils Relative: 0.6 % (ref 0.0–3.0)
Eosinophils Absolute: 0.2 10*3/uL (ref 0.0–0.7)
Eosinophils Relative: 2.9 % (ref 0.0–5.0)
HEMATOCRIT: 42.7 % (ref 36.0–46.0)
Hemoglobin: 14.3 g/dL (ref 12.0–15.0)
LYMPHS PCT: 28 % (ref 12.0–46.0)
Lymphs Abs: 2.1 10*3/uL (ref 0.7–4.0)
MCHC: 33.5 g/dL (ref 30.0–36.0)
MCV: 91.5 fl (ref 78.0–100.0)
Monocytes Absolute: 0.6 10*3/uL (ref 0.1–1.0)
Monocytes Relative: 8.7 % (ref 3.0–12.0)
NEUTROS ABS: 4.4 10*3/uL (ref 1.4–7.7)
Neutrophils Relative %: 59.8 % (ref 43.0–77.0)
Platelets: 271 10*3/uL (ref 150.0–400.0)
RBC: 4.67 Mil/uL (ref 3.87–5.11)
RDW: 13.7 % (ref 11.5–15.5)
WBC: 7.4 10*3/uL (ref 4.0–10.5)

## 2018-06-20 LAB — COMPREHENSIVE METABOLIC PANEL
ALBUMIN: 4.3 g/dL (ref 3.5–5.2)
ALT: 16 U/L (ref 0–35)
AST: 16 U/L (ref 0–37)
Alkaline Phosphatase: 80 U/L (ref 39–117)
BUN: 15 mg/dL (ref 6–23)
CO2: 26 mEq/L (ref 19–32)
Calcium: 9.2 mg/dL (ref 8.4–10.5)
Chloride: 106 mEq/L (ref 96–112)
Creatinine, Ser: 0.72 mg/dL (ref 0.40–1.20)
GFR: 79.86 mL/min (ref >60.00)
Glucose, Bld: 85 mg/dL (ref 70–99)
Potassium: 4.3 mEq/L (ref 3.5–5.1)
Sodium: 142 mEq/L (ref 135–145)
Total Bilirubin: 0.6 mg/dL (ref 0.2–1.2)
Total Protein: 6.6 g/dL (ref 6.0–8.3)

## 2018-06-20 LAB — LIPID PANEL
CHOL/HDL RATIO: 3
Cholesterol: 202 mg/dL — ABNORMAL HIGH (ref 0–200)
HDL: 63.2 mg/dL (ref >39.00)
LDL Cholesterol: 125 mg/dL — ABNORMAL HIGH (ref 0–99)
NonHDL: 138.34
TRIGLYCERIDES: 65 mg/dL (ref 0.0–149.0)
VLDL: 13 mg/dL (ref 0.0–40.0)

## 2018-06-20 LAB — TSH: TSH: 1.32 u[IU]/mL (ref 0.35–4.50)

## 2018-06-20 NOTE — Assessment & Plan Note (Addendum)
Bp at home well controlled, BP good here Will monitor at home and here annually Encouraged regular exercise CMP CBC, TSH

## 2018-06-20 NOTE — Assessment & Plan Note (Signed)
Lipid panel minimally elevated, but mostly because HDL was high Left carotid bruit heard Check lipid panel and depending on results of ultrasound may benefit from a statin

## 2018-06-20 NOTE — Assessment & Plan Note (Signed)
Left carotid bruit heard on exam Will check carotid ultrasound, lipid panel

## 2018-06-20 NOTE — Patient Instructions (Signed)
  Kelsey Dominguez , Thank you for taking time to come for your Medicare Wellness Visit. I appreciate your ongoing commitment to your health goals. Please review the following plan we discussed and let me know if I can assist you in the future.   These are the goals we discussed: Goals   Start regular exercise, start calcium, vitamin d and B12     This is a list of the screening recommended for you and due dates:  Health Maintenance  Topic Date Due  . Mammogram  06/28/1997  . DEXA scan (bone density measurement)  06/28/2012  . Tetanus Vaccine  06/21/2019*  . Colon Cancer Screening  07/01/2019  . Flu Shot  Completed  .  Hepatitis C: One time screening is recommended by Center for Disease Control  (CDC) for  adults born from [birth year] through 1963.   Completed  . Pneumonia vaccines  Completed  *Topic was postponed. The date shown is not the original due date.     Tests ordered today. Your results will be released to [place] (or called to you) after review, usually within 72hours after test completion. If any changes need to be made, you will be notified at that same time.  All other Health Maintenance issues reviewed.   All recommended immunizations and age-appropriate screenings are up-to-date or discussed.  No immunizations administered today.   Medications reviewed and updated.  Changes include :   none   Please followup in one year

## 2018-06-20 NOTE — Assessment & Plan Note (Signed)
DEXA done by gynecology-she will check with them to see if she is due Not currently taking vitamin D-encouraged taking vitamin D and calcium-discussed amounts Stressed the importance of regular exercise

## 2018-06-20 NOTE — Assessment & Plan Note (Signed)
Has taken propranolol in the past as needed At this point she is not taking does not think that she needs it We will discontinue from list, but can restart in the future if needed

## 2018-06-21 ENCOUNTER — Encounter: Payer: Self-pay | Admitting: Internal Medicine

## 2018-06-23 DIAGNOSIS — M25511 Pain in right shoulder: Secondary | ICD-10-CM | POA: Diagnosis not present

## 2018-06-29 ENCOUNTER — Ambulatory Visit (HOSPITAL_COMMUNITY)
Admission: RE | Admit: 2018-06-29 | Discharge: 2018-06-29 | Disposition: A | Discharge status: Home / Self Care | Payer: Medicare Other | Source: Ambulatory Visit | Attending: Cardiology | Admitting: Cardiology

## 2018-06-29 DIAGNOSIS — R0989 Other specified symptoms and signs involving the circulatory and respiratory systems: Secondary | ICD-10-CM

## 2018-06-29 DIAGNOSIS — M25511 Pain in right shoulder: Secondary | ICD-10-CM | POA: Diagnosis not present

## 2018-06-30 ENCOUNTER — Encounter: Payer: Self-pay | Admitting: Internal Medicine

## 2018-06-30 DIAGNOSIS — I771 Stricture of artery: Secondary | ICD-10-CM | POA: Insufficient documentation

## 2018-07-01 ENCOUNTER — Other Ambulatory Visit: Payer: Self-pay | Admitting: Internal Medicine

## 2018-07-01 DIAGNOSIS — I771 Stricture of artery: Secondary | ICD-10-CM

## 2018-07-04 ENCOUNTER — Encounter: Payer: Self-pay | Admitting: Internal Medicine

## 2018-07-04 DIAGNOSIS — M25511 Pain in right shoulder: Secondary | ICD-10-CM | POA: Diagnosis not present

## 2018-07-07 DIAGNOSIS — M25511 Pain in right shoulder: Secondary | ICD-10-CM | POA: Diagnosis not present

## 2018-07-11 ENCOUNTER — Encounter: Payer: Self-pay | Admitting: Internal Medicine

## 2018-07-11 DIAGNOSIS — M25511 Pain in right shoulder: Secondary | ICD-10-CM | POA: Diagnosis not present

## 2018-07-17 DIAGNOSIS — M25511 Pain in right shoulder: Secondary | ICD-10-CM | POA: Diagnosis not present

## 2018-07-28 ENCOUNTER — Encounter: Payer: Self-pay | Admitting: Internal Medicine

## 2018-08-01 DIAGNOSIS — M25511 Pain in right shoulder: Secondary | ICD-10-CM | POA: Diagnosis not present

## 2018-08-03 DIAGNOSIS — M7541 Impingement syndrome of right shoulder: Secondary | ICD-10-CM | POA: Insufficient documentation

## 2018-08-08 ENCOUNTER — Encounter: Payer: Self-pay | Admitting: Internal Medicine

## 2018-08-21 ENCOUNTER — Encounter: Payer: Medicare Other | Admitting: Surgery

## 2018-08-23 DIAGNOSIS — M7541 Impingement syndrome of right shoulder: Secondary | ICD-10-CM | POA: Diagnosis not present

## 2018-08-23 DIAGNOSIS — M25511 Pain in right shoulder: Secondary | ICD-10-CM | POA: Diagnosis not present

## 2018-09-27 ENCOUNTER — Encounter: Payer: Self-pay | Admitting: Internal Medicine

## 2018-10-16 DIAGNOSIS — Z124 Encounter for screening for malignant neoplasm of cervix: Secondary | ICD-10-CM | POA: Diagnosis not present

## 2018-11-13 DIAGNOSIS — M25511 Pain in right shoulder: Secondary | ICD-10-CM | POA: Diagnosis not present

## 2018-12-18 ENCOUNTER — Encounter: Payer: Self-pay | Admitting: Surgery

## 2018-12-18 ENCOUNTER — Ambulatory Visit (INDEPENDENT_AMBULATORY_CARE_PROVIDER_SITE_OTHER): Payer: Medicare Other | Admitting: Surgery

## 2018-12-18 ENCOUNTER — Other Ambulatory Visit: Payer: Self-pay

## 2018-12-18 VITALS — BP 155/85 | HR 80 | Temp 97.8°F | Resp 18 | Ht 62.0 in | Wt 135.5 lb

## 2018-12-18 DIAGNOSIS — I771 Stricture of artery: Secondary | ICD-10-CM

## 2018-12-18 DIAGNOSIS — Z8262 Family history of osteoporosis: Secondary | ICD-10-CM | POA: Diagnosis not present

## 2018-12-18 DIAGNOSIS — Z1231 Encounter for screening mammogram for malignant neoplasm of breast: Secondary | ICD-10-CM | POA: Diagnosis not present

## 2018-12-18 DIAGNOSIS — M8589 Other specified disorders of bone density and structure, multiple sites: Secondary | ICD-10-CM | POA: Diagnosis not present

## 2018-12-18 LAB — HM DEXA SCAN: HM Dexa Scan: -2.1

## 2018-12-18 NOTE — Progress Notes (Signed)
[facility] [place]  Patient name: Kelsey Dominguez MRN: [id number] DOB: 08-Oct-1945 Sex: female   REQUESTING PROVIDER:   Dr. Quay Burow   REASON FOR CONSULT:    Left subclavian artery stenosis  HISTORY OF PRESENT ILLNESS:   Kelsey Dominguez is a 73 y.o. female, who is referred today for evaluation of left subclavian artery stenosis.  This was detected when she underwent a carotid duplex for a left carotid bruit.  She denies having any left arm symptoms such as numbness or weakness with activity.  She denies any symptoms of subclavian steal. She does have a small family history of stroke and heart disease.   She is borderline hypertensive.  She suffers from hypercholesterolemia but is not taking a statin.  She is a former smoker.       PAST MEDICAL HISTORY    History reviewed. No pertinent past medical history.   FAMILY HISTORY   Family History  Problem Relation Age of Onset  . Lung cancer Mother   . Alzheimer's disease Father   . Heart disease Father   . Heart disease Brother        s/p stent  . Stroke Maternal Grandfather     SOCIAL HISTORY:   Social History   Socioeconomic History  . Marital status: Married    Spouse name: Not on file  . Number of children: Not on file  . Years of education: Not on file  . Highest education level: Not on file  Occupational History  . Not on file  Social Needs  . Financial resource strain: Not on file  . Food insecurity    Worry: Not on file    Inability: Not on file  . Transportation needs    Medical: Not on file    Non-medical: Not on file  Tobacco Use  . Smoking status: Former Research scientist (life sciences)  . Smokeless tobacco: Never Used  Substance and Sexual Activity  . Alcohol use: Yes    Comment: weekends only  . Drug use: No  . Sexual activity: Not on file  Lifestyle  . Physical activity    Days per week: Not on file    Minutes per session: Not on file  . Stress: Not on file   Relationships  . Social Herbalist on phone: Not on file    Gets together: Not on file    Attends religious service: Not on file    Active member of club or organization: Not on file    Attends meetings of clubs or organizations: Not on file    Relationship status: Not on file  . Intimate partner violence    Fear of current or ex partner: Not on file    Emotionally abused: Not on file    Physically abused: Not on file    Forced sexual activity: Not on file  Other Topics Concern  . Not on file  Social History Narrative   Walks 2-3 times a week    ALLERGIES:    No Known Allergies  CURRENT MEDICATIONS:    Current Outpatient Medications  Medication Sig Dispense Refill  . calcium-vitamin D (OSCAL WITH D) 500-200 MG-UNIT tablet Take 1 tablet by mouth.    . Cholecalciferol (VITAMIN D3) 2000 units TABS Take by mouth daily.    . vitamin B-12 (CYANOCOBALAMIN) 1000 MCG tablet Take 1,000 mcg by mouth daily.     No current facility-administered medications for this visit.     REVIEW OF  SYSTEMS:   [X]  denotes positive finding, [ ]  denotes negative finding Cardiac  Comments:  Chest pain or chest pressure:    Shortness of breath upon exertion:    Short of breath when lying flat:    Irregular heart rhythm:        Vascular    Pain in calf, thigh, or hip brought on by ambulation:    Pain in feet at night that wakes you up from your sleep:     Blood clot in your veins:    Leg swelling:         Pulmonary    Oxygen at home:    Productive cough:     Wheezing:         Neurologic    Sudden weakness in arms or legs:  x   Sudden numbness in arms or legs:     Sudden onset of difficulty speaking or slurred speech:    Temporary loss of vision in one eye:     Problems with dizziness:         Gastrointestinal    Blood in stool:      Vomited blood:         Genitourinary    Burning when urinating:     Blood in urine:        Psychiatric    Major depression:          Hematologic    Bleeding problems:    Problems with blood clotting too easily:        Skin    Rashes or ulcers:        Constitutional    Fever or chills:     PHYSICAL EXAM:   Vitals:   12/18/18 1145 12/18/18 1150  BP: 139/85 (!) 155/85  Pulse: 80   Resp: 18   Temp: 97.8 F (36.6 C)   SpO2: 97%   Weight: 135 lb 8 oz (61.5 kg)   Height: 5\' 2"  (1.575 m)     GENERAL: The patient is a well-nourished female, in no acute distress. The vital signs are documented above. CARDIAC: There is a regular rate and rhythm.  VASCULAR: Palpable bilateral radial and bilateral pedal pulses PULMONARY: Nonlabored respirations MUSCULOSKELETAL: There are no major deformities or cyanosis. NEUROLOGIC: No focal weakness or paresthesias are detected. SKIN: There are no ulcers or rashes noted. PSYCHIATRIC: The patient has a normal affect.  STUDIES:   I have reviewed the following studies:   Right Carotid: The extracranial vessels were near-normal with only minimal wall                thickening or plaque.   Left Carotid: Velocities in the left ICA are consistent with a 1-39% stenosis.  Vertebrals:  Right vertebral artery demonstrates antegrade flow. Atypical flow              noted in the left vertebral artery. Subclavians: Left subclavian artery was stenotic. Left subclavian artery flow              was disturbed. Normal flow hemodynamics were seen in the right              subclavian artery. ASSESSMENT and PLAN   Left subclavian artery stenosis: The patient is completely asymptomatic and therefore no intervention should be considered.  I did discuss with the patient that her blood pressure measurements need to be performed on the right arm as the left arm will not be accurate.  Treatment decision should be made  off right arm measurements.  Her LDL cholesterol is 125 and her total is 202.  Given that she now has evidence of atherosclerotic vascular disease, she should consider starting a  statin therapy.  I will leave this to Dr. Quay Burow discretion.  I did talk to the patient about improving her diet and increasing her activity level.  We have gone over what to watch out for should this progress, and she will contact me if she experiences any of these systems.  She will follow-up with me on an as-needed basis.   Leia Alf, MD, FACS [facility] [phone] Pager [phone]

## 2018-12-19 ENCOUNTER — Encounter: Payer: Self-pay | Admitting: Internal Medicine

## 2018-12-19 DIAGNOSIS — H2513 Age-related nuclear cataract, bilateral: Secondary | ICD-10-CM | POA: Diagnosis not present

## 2018-12-19 DIAGNOSIS — H524 Presbyopia: Secondary | ICD-10-CM | POA: Diagnosis not present

## 2018-12-19 DIAGNOSIS — H43813 Vitreous degeneration, bilateral: Secondary | ICD-10-CM | POA: Diagnosis not present

## 2018-12-19 DIAGNOSIS — H40023 Open angle with borderline findings, high risk, bilateral: Secondary | ICD-10-CM | POA: Diagnosis not present

## 2018-12-26 ENCOUNTER — Encounter: Payer: Self-pay | Admitting: Internal Medicine

## 2018-12-26 NOTE — Progress Notes (Signed)
Abstracted and sent to scan  

## 2019-01-01 ENCOUNTER — Encounter: Payer: Self-pay | Admitting: Internal Medicine

## 2019-02-08 ENCOUNTER — Ambulatory Visit: Payer: Medicare Other

## 2019-02-26 ENCOUNTER — Other Ambulatory Visit: Payer: Self-pay

## 2019-02-26 ENCOUNTER — Ambulatory Visit (INDEPENDENT_AMBULATORY_CARE_PROVIDER_SITE_OTHER): Payer: Medicare Other

## 2019-02-26 DIAGNOSIS — Z23 Encounter for immunization: Secondary | ICD-10-CM

## 2019-02-26 DIAGNOSIS — D1801 Hemangioma of skin and subcutaneous tissue: Secondary | ICD-10-CM | POA: Diagnosis not present

## 2019-02-26 DIAGNOSIS — L570 Actinic keratosis: Secondary | ICD-10-CM | POA: Diagnosis not present

## 2019-02-26 DIAGNOSIS — L814 Other melanin hyperpigmentation: Secondary | ICD-10-CM | POA: Diagnosis not present

## 2019-02-26 DIAGNOSIS — D225 Melanocytic nevi of trunk: Secondary | ICD-10-CM | POA: Diagnosis not present

## 2019-02-26 DIAGNOSIS — L821 Other seborrheic keratosis: Secondary | ICD-10-CM | POA: Diagnosis not present

## 2019-04-20 DIAGNOSIS — D225 Melanocytic nevi of trunk: Secondary | ICD-10-CM | POA: Diagnosis not present

## 2019-04-20 DIAGNOSIS — L570 Actinic keratosis: Secondary | ICD-10-CM | POA: Diagnosis not present

## 2019-05-21 DIAGNOSIS — H40023 Open angle with borderline findings, high risk, bilateral: Secondary | ICD-10-CM | POA: Diagnosis not present

## 2019-05-29 ENCOUNTER — Ambulatory Visit: Payer: Medicare Other | Attending: Internal Medicine

## 2019-05-29 DIAGNOSIS — Z23 Encounter for immunization: Secondary | ICD-10-CM

## 2019-05-29 NOTE — Progress Notes (Signed)
   Covid-19 Vaccination Clinic  Name:  Kelsey Dominguez    MRN: [id number] DOB: Mar 17, 1946  05/29/2019  [name] was observed post Covid-19 immunization for 15 minutes without incidence. She was provided with Vaccine Information Sheet and instruction to access the V-Safe system.   [name] was instructed to call 911 with any severe reactions post vaccine: Marland Kitchen Difficulty breathing  . Swelling of your face and throat  . A fast heartbeat  . A bad rash all over your body  . Dizziness and weakness    Immunizations Administered    Name Date Dose VIS Date Route   Pfizer COVID-19 Vaccine 05/29/2019  2:56 PM 0.3 mL 04/20/2019 Intramuscular   Manufacturer: [place]   Lot: [id number]   [place]: [id number]

## 2019-06-04 DIAGNOSIS — M7732 Calcaneal spur, left foot: Secondary | ICD-10-CM | POA: Diagnosis not present

## 2019-06-04 DIAGNOSIS — M24575 Contracture, left foot: Secondary | ICD-10-CM | POA: Diagnosis not present

## 2019-06-04 DIAGNOSIS — M7662 Achilles tendinitis, left leg: Secondary | ICD-10-CM | POA: Diagnosis not present

## 2019-06-04 DIAGNOSIS — M9262 Juvenile osteochondrosis of tarsus, left ankle: Secondary | ICD-10-CM | POA: Diagnosis not present

## 2019-06-19 ENCOUNTER — Ambulatory Visit: Payer: Medicare Other | Attending: Internal Medicine

## 2019-06-19 ENCOUNTER — Ambulatory Visit: Payer: Medicare Other

## 2019-06-19 DIAGNOSIS — Z23 Encounter for immunization: Secondary | ICD-10-CM

## 2019-06-19 NOTE — Progress Notes (Signed)
   Covid-19 Vaccination Clinic  Name:  Kelsey Dominguez    MRN: [id number] DOB: 04-22-1946  06/19/2019  [name] was observed post Covid-19 immunization for 15 minutes without incidence. She was provided with Vaccine Information Sheet and instruction to access the V-Safe system.   [name] was instructed to call 911 with any severe reactions post vaccine: Marland Kitchen Difficulty breathing  . Swelling of your face and throat  . A fast heartbeat  . A bad rash all over your body  . Dizziness and weakness    Immunizations Administered    Name Date Dose VIS Date Route   Pfizer COVID-19 Vaccine 06/19/2019 10:11 AM 0.3 mL 04/20/2019 Intramuscular   Manufacturer: [place]   Lot: [id number]   [place]: [id number]

## 2019-07-02 DIAGNOSIS — H40023 Open angle with borderline findings, high risk, bilateral: Secondary | ICD-10-CM | POA: Diagnosis not present

## 2019-07-15 ENCOUNTER — Encounter: Payer: Self-pay | Admitting: Internal Medicine

## 2019-07-16 ENCOUNTER — Telehealth: Payer: Self-pay | Admitting: Internal Medicine

## 2019-07-16 DIAGNOSIS — M8589 Other specified disorders of bone density and structure, multiple sites: Secondary | ICD-10-CM

## 2019-07-16 DIAGNOSIS — R030 Elevated blood-pressure reading, without diagnosis of hypertension: Secondary | ICD-10-CM

## 2019-07-16 DIAGNOSIS — E782 Mixed hyperlipidemia: Secondary | ICD-10-CM

## 2019-07-16 NOTE — Telephone Encounter (Signed)
    Patient request order for annual labs to be done at [facility] location

## 2019-07-16 NOTE — Telephone Encounter (Signed)
Labs ordered.

## 2019-07-18 NOTE — Telephone Encounter (Signed)
Spoke with pt to get an appointment scheduled. States she will either my chart or call back to schedule an appointment. She did not have her calendar in front of her.

## 2019-08-07 ENCOUNTER — Other Ambulatory Visit: Payer: Self-pay

## 2019-08-07 ENCOUNTER — Encounter: Payer: Self-pay | Admitting: Family Medicine

## 2019-08-07 ENCOUNTER — Ambulatory Visit (INDEPENDENT_AMBULATORY_CARE_PROVIDER_SITE_OTHER): Payer: Medicare Other | Admitting: Family Medicine

## 2019-08-07 DIAGNOSIS — M25511 Pain in right shoulder: Secondary | ICD-10-CM | POA: Diagnosis not present

## 2019-08-07 DIAGNOSIS — M542 Cervicalgia: Secondary | ICD-10-CM | POA: Diagnosis not present

## 2019-08-07 DIAGNOSIS — G8929 Other chronic pain: Secondary | ICD-10-CM

## 2019-08-07 MED ORDER — CELECOXIB 200 MG PO CAPS: 200.0000 mg | ORAL_CAPSULE | Freq: Two times a day (BID) | ORAL | 6 refills | Status: DC | PRN

## 2019-08-07 NOTE — Patient Instructions (Signed)
   Glucosamine Sulfate:  1,000 mg twice daily  Turmeric:  500 mg twice daily   

## 2019-08-07 NOTE — Patient Instructions (Addendum)
Blood work was ordered.    All other Health Maintenance issues reviewed.   All recommended immunizations and age-appropriate screenings are up-to-date or discussed.  No immunization administered today.   Medications reviewed and updated.  Changes include :   none     Please followup in 1 year    Health Maintenance, Female Adopting a healthy lifestyle and getting preventive care are important in promoting health and wellness. Ask your health care provider about:  The right schedule for you to have regular tests and exams.  Things you can do on your own to prevent diseases and keep yourself healthy. What should I know about diet, weight, and exercise? Eat a healthy diet   Eat a diet that includes plenty of vegetables, fruits, low-fat dairy products, and lean protein.  Do not eat a lot of foods that are high in solid fats, added sugars, or sodium. Maintain a healthy weight Body mass index (BMI) is used to identify weight problems. It estimates body fat based on height and weight. Your health care provider can help determine your BMI and help you achieve or maintain a healthy weight. Get regular exercise Get regular exercise. This is one of the most important things you can do for your health. Most adults should:  Exercise for at least 150 minutes each week. The exercise should increase your heart rate and make you sweat (moderate-intensity exercise).  Do strengthening exercises at least twice a week. This is in addition to the moderate-intensity exercise.  Spend less time sitting. Even light physical activity can be beneficial. Watch cholesterol and blood lipids Have your blood tested for lipids and cholesterol at 72 years of age, then have this test every 5 years. Have your cholesterol levels checked more often if:  Your lipid or cholesterol levels are high.  You are older than 72 years of age.  You are at high risk for heart disease. What should I know about cancer  screening? Depending on your health history and family history, you may need to have cancer screening at various ages. This may include screening for:  Breast cancer.  Cervical cancer.  Colorectal cancer.  Skin cancer.  Lung cancer. What should I know about heart disease, diabetes, and high blood pressure? Blood pressure and heart disease  High blood pressure causes heart disease and increases the risk of stroke. This is more likely to develop in people who have high blood pressure readings, are of African descent, or are overweight.  Have your blood pressure checked: ? Every 3-5 years if you are 18-39 years of age. ? Every year if you are 40 years old or older. Diabetes Have regular diabetes screenings. This checks your fasting blood sugar level. Have the screening done:  Once every three years after age 40 if you are at a normal weight and have a low risk for diabetes.  More often and at a younger age if you are overweight or have a high risk for diabetes. What should I know about preventing infection? Hepatitis B If you have a higher risk for hepatitis B, you should be screened for this virus. Talk with your health care provider to find out if you are at risk for hepatitis B infection. Hepatitis C Testing is recommended for:  Everyone born from 1945 through 1965.  Anyone with known risk factors for hepatitis C. Sexually transmitted infections (STIs)  Get screened for STIs, including gonorrhea and chlamydia, if: ? You are sexually active and are younger than 72 years   of age. ? You are older than 72 years of age and your health care provider tells you that you are at risk for this type of infection. ? Your sexual activity has changed since you were last screened, and you are at increased risk for chlamydia or gonorrhea. Ask your health care provider if you are at risk.  Ask your health care provider about whether you are at high risk for HIV. Your health care provider may  recommend a prescription medicine to help prevent HIV infection. If you choose to take medicine to prevent HIV, you should first get tested for HIV. You should then be tested every 3 months for as long as you are taking the medicine. Pregnancy  If you are about to stop having your period (premenopausal) and you may become pregnant, seek counseling before you get pregnant.  Take 400 to 800 micrograms (mcg) of folic acid every day if you become pregnant.  Ask for birth control (contraception) if you want to prevent pregnancy. Osteoporosis and menopause Osteoporosis is a disease in which the bones lose minerals and strength with aging. This can result in bone fractures. If you are 65 years old or older, or if you are at risk for osteoporosis and fractures, ask your health care provider if you should:  Be screened for bone loss.  Take a calcium or vitamin D supplement to lower your risk of fractures.  Be given hormone replacement therapy (HRT) to treat symptoms of menopause. Follow these instructions at home: Lifestyle  Do not use any products that contain nicotine or tobacco, such as cigarettes, e-cigarettes, and chewing tobacco. If you need help quitting, ask your health care provider.  Do not use street drugs.  Do not share needles.  Ask your health care provider for help if you need support or information about quitting drugs. Alcohol use  Do not drink alcohol if: ? Your health care provider tells you not to drink. ? You are pregnant, may be pregnant, or are planning to become pregnant.  If you drink alcohol: ? Limit how much you use to 0-1 drink a day. ? Limit intake if you are breastfeeding.  Be aware of how much alcohol is in your drink. In the [place] one drink equals one 12 oz bottle of beer (355 mL), one 5 oz glass of wine (148 mL), or one 1 oz glass of hard liquor (44 mL). General instructions  Schedule regular health, dental, and eye exams.  Stay current with your  vaccines.  Tell your health care provider if: ? You often feel depressed. ? You have ever been abused or do not feel safe at home. Summary  Adopting a healthy lifestyle and getting preventive care are important in promoting health and wellness.  Follow your health care provider's instructions about healthy diet, exercising, and getting tested or screened for diseases.  Follow your health care provider's instructions on monitoring your cholesterol and blood pressure. This information is not intended to replace advice given to you by your health care provider. Make sure you discuss any questions you have with your health care provider. Document Revised: 04/19/2018 Document Reviewed: 04/19/2018 Elsevier Patient Education  2020 Elsevier Inc.  

## 2019-08-07 NOTE — Progress Notes (Signed)
Subjective:    Patient ID: Kelsey Dominguez, female    DOB: August 09, 1945, 74 y.o.   MRN: [id number]  HPI Here for subsequent medicare wellness exam.   I have personally reviewed and have noted 1.         The patient's medical and social history 2.         Their use of alcohol, tobacco or illicit drugs 3.         Their current medications and supplements 4.         The patient's functional ability including ADL's, fall risks,                home safety risk and hearing or visual impairment. 5.         Diet and physical activities 6.         Evidence for depression or mood disorders 7.         Care team reviewed  - gyn, GI - Dr Earlean Shawl   still has R shoulder pain. Has pretty good ROM. Has pain with certain activities.  She is taking celebrex.  She is doing PT.  She has chronic neck pain.     Are there smokers in your home (other than you)? No  Risk Factors Exercise:  Walking  Dietary issues discussed:  well balanced, does not eat as many veges as she should  Vitamin and supplement use:   Reviewed, med list updated, agree wth current supplements  Opiod use:   N/A   Cardiac risk factors: advanced age  Depression Screen  Have you felt down, depressed or hopeless? No  Have you felt little interest or pleasure in doing things?  No  Activities of Daily Living In your present state of health, do you have any difficulty performing the following activities?:  Driving? No Managing money?  No Feeding yourself? No Getting from bed to chair? No Climbing a flight of stairs? No Preparing food and eating?: No Bathing or showering? No Getting dressed: No Getting to/using the toilet? No Moving around from place to place: No In the past year have you fallen or had a near fall?: No   Are you sexually active?  yes  Do you have more than one partner?  No  Hearing Difficulties: No Do you often ask people to speak up or repeat themselves? No Do you experience ringing or noises in your  ears? No Do you have difficulty understanding soft or whispered voices? yes Vision:              Any change in vision:  no             Up to date with eye exam:  yes  Memory:  Do you feel that you have a problem with memory? No  Do you often misplace items? No  Do you feel safe at home?  Yes  Cognitive Testing  Alert, Orientated? Yes  Normal Appearance? Yes  Recall of three objects?  Yes  Can perform simple calculations? Yes  Displays appropriate judgment? Yes  Can read the correct time from a watch face? Yes   Advanced Directives have been discussed with the patient? Yes - in place    Medications and allergies reviewed with patient and updated if appropriate.  Patient Active Problem List   Diagnosis Date Noted  . Glaucoma 08/08/2019  . Vitamin D deficiency 08/08/2019  . Stenosis of left subclavian artery ([place]) 06/30/2018  . Left carotid bruit 06/20/2018  .  Hyperlipidemia 06/20/2018  . Memory difficulties 06/06/2017  . AC joint arthropathy 03/13/2016  . Osteopenia 03/13/2016  . Performance anxiety 03/13/2016  . White coat syndrome without diagnosis of hypertension 03/13/2016  . Osteoarthritis, hip, bilateral 11/25/2015    Current Outpatient Medications on File Prior to Visit  Medication Sig Dispense Refill  . calcium-vitamin D (OSCAL WITH D) 500-200 MG-UNIT tablet Take 1 tablet by mouth.    . celecoxib (CELEBREX) 200 MG capsule Take 1 capsule (200 mg total) by mouth 2 (two) times daily as needed. 60 capsule 6  . Cholecalciferol (VITAMIN D3) 2000 units TABS Take 4,000 Units by mouth daily.     . Glucosamine-Chondroit-Vit C-Mn (GLUCOSAMINE 1500 COMPLEX PO) Take 1,500 mg by mouth.    . TURMERIC PO Take 1,000 mg by mouth.    . vitamin B-12 (CYANOCOBALAMIN) 1000 MCG tablet Take 1,000 mcg by mouth daily.     No current facility-administered medications on file prior to visit.    No past medical history on file.  Past Surgical History:  Procedure Laterality Date  .  LIPOSUCTION HEAD / NECK     chin and neck    Social History   Socioeconomic History  . Marital status: Married    Spouse name: Not on file  . Number of children: Not on file  . Years of education: Not on file  . Highest education level: Not on file  Occupational History  . Not on file  Tobacco Use  . Smoking status: Former Research scientist (life sciences)  . Smokeless tobacco: Never Used  Substance and Sexual Activity  . Alcohol use: Yes    Comment: weekends only  . Drug use: No  . Sexual activity: Not on file  Other Topics Concern  . Not on file  Social History Narrative   Walks 2-3 times a week   Social Determinants of Health   Financial Resource Strain:   . Difficulty of Paying Living Expenses:   Food Insecurity:   . Worried About Charity fundraiser in the Last Year:   . Arboriculturist in the Last Year:   Transportation Needs:   . Film/video editor (Medical):   Marland Kitchen Lack of Transportation (Non-Medical):   Physical Activity:   . Days of Exercise per Week:   . Minutes of Exercise per Session:   Stress:   . Feeling of Stress :   Social Connections:   . Frequency of Communication with Friends and Family:   . Frequency of Social Gatherings with Friends and Family:   . Attends Religious Services:   . Active Member of Clubs or Organizations:   . Attends Archivist Meetings:   Marland Kitchen Marital Status:     Family History  Problem Relation Age of Onset  . Lung cancer Mother   . Alzheimer's disease Father   . Heart disease Father   . Heart disease Brother        s/p stent  . Stroke Maternal Grandfather     Review of Systems  Constitutional: Negative for chills, fatigue and fever.  HENT: Negative for hearing loss and tinnitus.   Eyes: Negative for visual disturbance.  Respiratory: Negative for cough, shortness of breath and wheezing.   Cardiovascular: Negative for chest pain, palpitations and leg swelling.  Gastrointestinal: Negative for abdominal pain, blood in stool,  constipation, diarrhea and nausea.       No gerd  Musculoskeletal: Positive for arthralgias and neck pain.       Achilles tendonitis  Neurological: Positive for numbness (occ at night - in one of her hands). Negative for dizziness, light-headedness and headaches.  Psychiatric/Behavioral: Negative for dysphoric mood. The patient is not nervous/anxious.        Objective:   Vitals:   08/08/19 1028  BP: 140/78  Pulse: 75  Resp: 16  Temp: 98.4 F (36.9 C)  SpO2: 97%   BP Readings from Last 3 Encounters:  08/08/19 140/78  12/18/18 (!) 155/85  06/20/18 124/82   Wt Readings from Last 3 Encounters:  08/08/19 139 lb 6.4 oz (63.2 kg)  12/18/18 135 lb 8 oz (61.5 kg)  06/20/18 135 lb (61.2 kg)   Body mass index is 25.5 kg/m.    Depression screen [facility] 2/9 08/08/2019 06/20/2018 06/06/2017 04/23/2016  Decreased Interest 0 0 0 0  Down, Depressed, Hopeless 0 0 0 0  PHQ - 2 Score 0 0 0 0  Altered sleeping 0 - - -  Tired, decreased energy 0 - - -  Change in appetite 0 - - -  Feeling bad or failure about yourself  0 - - -  Trouble concentrating 0 - - -  Moving slowly or fidgety/restless 0 - - -  Suicidal thoughts 0 - - -  PHQ-9 Score 0 - - -      Physical Exam    Constitutional: Appears well-developed and well-nourished. No distress.  HENT:  Head: Normocephalic and atraumatic.  Neck: Neck supple. No tracheal deviation present. No thyromegaly present.  No cervical lymphadenopathy Cardiovascular: Normal rate, regular rhythm and normal heart sounds.   No murmur heard. No carotid bruit .  No edema Pulmonary/Chest: Effort normal and breath sounds normal. No respiratory distress. No has no wheezes. No rales. Abdomen: soft, NT, ND Skin: Skin is warm and dry. Not diaphoretic.  Psychiatric: Normal mood and affect. Behavior is normal.      Assessment & Plan:    Health Maintenance  Topic Date Due  . COLONOSCOPY  07/01/2019  . MAMMOGRAM  08/24/2019  . DEXA SCAN  12/17/2020  .  TETANUS/TDAP  07/19/2029  . INFLUENZA VACCINE  Completed  . Hepatitis C Screening  Completed  . PNA vac Low Risk Adult  Completed     Wellness Exam: Immunizations   Up to date  Colonoscopy  Scheduled with Dr Earlean Shawl [facility] - Dr Benjie Karvonen, will schedule-due now Dexa  Up to date  Gyn  Up to date  - Dr Dalbert Garnet exam  Up to date  Hearing loss  No significant hearing loss.  She is not concerned Memory concerns/difficulties-feels her memory is normal for her age.  At times she does have difficulty with recall, but there has been no changes since she was here last year and nothing she is concerned about Independent of ADLs-independent Stressed the importance of regular exercise   Patient received copy of preventative screening tests/immunizations recommended for the next 5-10 years.  See Problem List for Assessment and Plan of chronic medical problems.    This visit occurred during the SARS-CoV-2 public health emergency.  Safety protocols were in place, including screening questions prior to the visit, additional usage of staff PPE, and extensive cleaning of exam room while observing appropriate contact time as indicated for disinfecting solutions.

## 2019-08-07 NOTE — Progress Notes (Signed)
Office Visit Note   Patient: Kelsey Dominguez           Date of Birth: [birth year]           MRN: [id number] Visit Date: 08/07/2019 Requested by: Binnie Rail, MD [facility],  [place] [address] PCP: Binnie Rail, MD  Subjective: Chief Complaint  Patient presents with  . Neck - Pain    Pain over a year in neck and right shoulder. Has had some falls over the last 5 years, unsure if that is cause of pain. Known RTC rt shoulder (saw Dr. Onnie Graham) & OA in neck. Referred here by [facility].  Marland Kitchen Right Shoulder - Pain    HPI: She is here at the request of Kym Groom for neck pain and right shoulder pain.  Her neck has bothered her for at least 10 years.  At 1 point she had x-rays obtained and was told she had arthritis.  She does not have radicular pain, just stiffness and pain along the spine.  She has been to physical therapy in the past which gave her some improvement.  She is now doing physical therapy and has had some relief but she seems to have reached a plateau.  She does not take medication for her pain.  Her right shoulder has been injured for several years.  She did not recall an event that caused it, but she saw Dr. Onnie Graham who did x-rays and an MRI scan showing a torn rotator cuff.  Surgery was recommended but she did not want to proceed with that.  She was given an injection and did physical therapy and her pain became more manageable.  It bothers her through the day at various times, and it bothers her at night when she rolls over on her side.  Pain seems to be mostly in the anterior aspect.               ROS:   All other systems were reviewed and are negative.  Objective: Vital Signs: There were no vitals taken for this visit.  Physical Exam:  General:  Alert and oriented, in no acute distress. Pulm:  Breathing unlabored. Psy:  Normal mood, congruent affect.  Neck: She has symmetrically decreased rotation and decreased upward gaze.  Spurling's test is negative.   She has tenderness in the paraspinous muscles all along the cervical spine.  Upper extremity strength and reflexes are normal with the exception of right shoulder external rotation which is weak at 4/5. Right shoulder: She is tender over the long head biceps tendon and a little bit tender at the [facility] joint.  She has some tenderness in the posterior subacromial space.  Range of motion is symmetric with the left except for internal rotation which is about 45 degrees on the right and closer to 80 degrees on the left.  Speeds test is positive, and as mentioned she has weakness with external rotation.   Imaging: None today  Assessment & Plan: 1.  Chronic neck pain with history of osteoarthritis per patient report, neurologic exam is nonfocal -We will try glucosamine and turmeric.  Celebrex as needed.  Continue with physical therapy exercises.  Could contemplate referral for facet injections if symptoms worsen.  2.  Chronic right shoulder pain with infraspinatus partial tear and probable long head biceps tendinopathy and AC joint arthropathy -She is very hesitant to undergo surgery.  Her symptoms are tolerable right now.  We will try the above conservative treatments.  If pain worsens, could contemplate biceps tendon injection or possibly AC joint injection.     Procedures: No procedures performed  No notes on file     PMFS History: Patient Active Problem List   Diagnosis Date Noted  . Stenosis of left subclavian artery ([place]) 06/30/2018  . Left carotid bruit 06/20/2018  . Hyperlipidemia 06/20/2018  . Memory difficulties 06/06/2017  . AC joint arthropathy 03/13/2016  . Osteopenia 03/13/2016  . Performance anxiety 03/13/2016  . White coat syndrome without diagnosis of hypertension 03/13/2016  . Osteoarthritis, hip, bilateral 11/25/2015   History reviewed. No pertinent past medical history.  Family History  Problem Relation Age of Onset  . Lung cancer Mother   . Alzheimer's disease  Father   . Heart disease Father   . Heart disease Brother        s/p stent  . Stroke Maternal Grandfather     Past Surgical History:  Procedure Laterality Date  . LIPOSUCTION HEAD / NECK     chin and neck   Social History   Occupational History  . Not on file  Tobacco Use  . Smoking status: Former Research scientist (life sciences)  . Smokeless tobacco: Never Used  Substance and Sexual Activity  . Alcohol use: Yes    Comment: weekends only  . Drug use: No  . Sexual activity: Not on file

## 2019-08-08 ENCOUNTER — Ambulatory Visit (INDEPENDENT_AMBULATORY_CARE_PROVIDER_SITE_OTHER): Payer: Medicare Other | Admitting: Internal Medicine

## 2019-08-08 ENCOUNTER — Other Ambulatory Visit: Payer: Self-pay

## 2019-08-08 ENCOUNTER — Encounter: Payer: Self-pay | Admitting: Internal Medicine

## 2019-08-08 VITALS — BP 140/78 | HR 75 | Temp 98.4°F | Resp 16 | Ht 62.0 in | Wt 139.4 lb

## 2019-08-08 DIAGNOSIS — M8589 Other specified disorders of bone density and structure, multiple sites: Secondary | ICD-10-CM | POA: Diagnosis not present

## 2019-08-08 DIAGNOSIS — Z0000 Encounter for general adult medical examination without abnormal findings: Secondary | ICD-10-CM | POA: Diagnosis not present

## 2019-08-08 DIAGNOSIS — R030 Elevated blood-pressure reading, without diagnosis of hypertension: Secondary | ICD-10-CM | POA: Diagnosis not present

## 2019-08-08 DIAGNOSIS — H409 Unspecified glaucoma: Secondary | ICD-10-CM | POA: Insufficient documentation

## 2019-08-08 DIAGNOSIS — E559 Vitamin D deficiency, unspecified: Secondary | ICD-10-CM | POA: Diagnosis not present

## 2019-08-08 DIAGNOSIS — E782 Mixed hyperlipidemia: Secondary | ICD-10-CM

## 2019-08-08 LAB — VITAMIN D 25 HYDROXY (VIT D DEFICIENCY, FRACTURES): VITD: 41.4 ng/mL (ref 30.00–100.00)

## 2019-08-08 LAB — LIPID PANEL
Cholesterol: 216 mg/dL — ABNORMAL HIGH (ref 0–200)
HDL: 75.7 mg/dL (ref >39.00)
LDL Cholesterol: 126 mg/dL — ABNORMAL HIGH (ref 0–99)
NonHDL: 139.92
Total CHOL/HDL Ratio: 3
Triglycerides: 71 mg/dL (ref 0.0–149.0)
VLDL: 14.2 mg/dL (ref 0.0–40.0)

## 2019-08-08 LAB — COMPREHENSIVE METABOLIC PANEL
ALT: 15 U/L (ref 0–35)
AST: 18 U/L (ref 0–37)
Albumin: 4.3 g/dL (ref 3.5–5.2)
Alkaline Phosphatase: 86 U/L (ref 39–117)
BUN: 19 mg/dL (ref 6–23)
CO2: 27 mEq/L (ref 19–32)
Calcium: 9.6 mg/dL (ref 8.4–10.5)
Chloride: 105 mEq/L (ref 96–112)
Creatinine, Ser: 0.75 mg/dL (ref 0.40–1.20)
GFR: 75.94 mL/min (ref >60.00)
Glucose, Bld: 90 mg/dL (ref 70–99)
Potassium: 4.2 mEq/L (ref 3.5–5.1)
Sodium: 139 mEq/L (ref 135–145)
Total Bilirubin: 0.4 mg/dL (ref 0.2–1.2)
Total Protein: 7 g/dL (ref 6.0–8.3)

## 2019-08-08 NOTE — Assessment & Plan Note (Signed)
DEXA up-to-date Has been ordered by her gynecologist Stressed the importance of regular exercise Continue calcium and vitamin D daily

## 2019-08-08 NOTE — Assessment & Plan Note (Addendum)
White coat htn BP at home 123/77 earlier today BP 133/77, 135/73, 126/66 Monitor at home Has been well controlled-no need for medication

## 2019-08-08 NOTE — Assessment & Plan Note (Signed)
Chronic Check lipid only mildly elevated Controlled with lifestyle-overall risk low

## 2019-08-08 NOTE — Assessment & Plan Note (Signed)
Taking supplementation Check level

## 2019-08-11 ENCOUNTER — Encounter: Payer: Self-pay | Admitting: Family Medicine

## 2019-08-11 ENCOUNTER — Encounter: Payer: Self-pay | Admitting: Internal Medicine

## 2019-08-29 DIAGNOSIS — Z860101 Personal history of adenomatous and serrated colon polyps: Secondary | ICD-10-CM | POA: Insufficient documentation

## 2019-08-29 DIAGNOSIS — K5790 Diverticulosis of intestine, part unspecified, without perforation or abscess without bleeding: Secondary | ICD-10-CM | POA: Insufficient documentation

## 2019-08-30 DIAGNOSIS — Z86010 Personal history of colonic polyps: Secondary | ICD-10-CM | POA: Diagnosis not present

## 2019-08-30 DIAGNOSIS — Z8371 Family history of colonic polyps: Secondary | ICD-10-CM | POA: Diagnosis not present

## 2019-08-30 DIAGNOSIS — Z1211 Encounter for screening for malignant neoplasm of colon: Secondary | ICD-10-CM | POA: Diagnosis not present

## 2019-08-30 DIAGNOSIS — K5730 Diverticulosis of large intestine without perforation or abscess without bleeding: Secondary | ICD-10-CM | POA: Diagnosis not present

## 2019-08-30 LAB — HM COLONOSCOPY

## 2019-09-06 ENCOUNTER — Encounter: Payer: Self-pay | Admitting: Internal Medicine

## 2019-09-19 DIAGNOSIS — H40021 Open angle with borderline findings, high risk, right eye: Secondary | ICD-10-CM | POA: Diagnosis not present

## 2019-10-22 DIAGNOSIS — L570 Actinic keratosis: Secondary | ICD-10-CM | POA: Diagnosis not present

## 2019-10-22 DIAGNOSIS — L821 Other seborrheic keratosis: Secondary | ICD-10-CM | POA: Diagnosis not present

## 2019-10-22 DIAGNOSIS — D2262 Melanocytic nevi of left upper limb, including shoulder: Secondary | ICD-10-CM | POA: Diagnosis not present

## 2019-10-22 DIAGNOSIS — D2271 Melanocytic nevi of right lower limb, including hip: Secondary | ICD-10-CM | POA: Diagnosis not present

## 2019-10-22 DIAGNOSIS — D485 Neoplasm of uncertain behavior of skin: Secondary | ICD-10-CM | POA: Diagnosis not present

## 2019-10-22 DIAGNOSIS — D1801 Hemangioma of skin and subcutaneous tissue: Secondary | ICD-10-CM | POA: Diagnosis not present

## 2019-10-22 DIAGNOSIS — D225 Melanocytic nevi of trunk: Secondary | ICD-10-CM | POA: Diagnosis not present

## 2019-10-23 DIAGNOSIS — Z124 Encounter for screening for malignant neoplasm of cervix: Secondary | ICD-10-CM | POA: Diagnosis not present

## 2019-10-24 DIAGNOSIS — H40022 Open angle with borderline findings, high risk, left eye: Secondary | ICD-10-CM | POA: Diagnosis not present

## 2019-12-24 DIAGNOSIS — Z1231 Encounter for screening mammogram for malignant neoplasm of breast: Secondary | ICD-10-CM | POA: Diagnosis not present

## 2019-12-24 LAB — HM MAMMOGRAPHY

## 2020-01-03 ENCOUNTER — Encounter: Payer: Self-pay | Admitting: Internal Medicine

## 2020-01-03 NOTE — Progress Notes (Signed)
Outside notes received. Information abstracted. Notes sent to scan.  

## 2020-01-07 DIAGNOSIS — N6312 Unspecified lump in the right breast, upper inner quadrant: Secondary | ICD-10-CM | POA: Diagnosis not present

## 2020-01-24 ENCOUNTER — Encounter: Payer: Self-pay | Admitting: Internal Medicine

## 2020-01-24 DIAGNOSIS — M25559 Pain in unspecified hip: Secondary | ICD-10-CM

## 2020-02-05 DIAGNOSIS — M25551 Pain in right hip: Secondary | ICD-10-CM | POA: Diagnosis not present

## 2020-02-05 DIAGNOSIS — M1611 Unilateral primary osteoarthritis, right hip: Secondary | ICD-10-CM | POA: Diagnosis not present

## 2020-02-06 DIAGNOSIS — Z23 Encounter for immunization: Secondary | ICD-10-CM | POA: Diagnosis not present

## 2020-02-12 DIAGNOSIS — M25551 Pain in right hip: Secondary | ICD-10-CM | POA: Insufficient documentation

## 2020-02-20 DIAGNOSIS — M25551 Pain in right hip: Secondary | ICD-10-CM | POA: Diagnosis not present

## 2020-02-26 DIAGNOSIS — H524 Presbyopia: Secondary | ICD-10-CM | POA: Diagnosis not present

## 2020-02-26 DIAGNOSIS — H40022 Open angle with borderline findings, high risk, left eye: Secondary | ICD-10-CM | POA: Diagnosis not present

## 2020-02-26 DIAGNOSIS — H2513 Age-related nuclear cataract, bilateral: Secondary | ICD-10-CM | POA: Diagnosis not present

## 2020-02-26 DIAGNOSIS — H43813 Vitreous degeneration, bilateral: Secondary | ICD-10-CM | POA: Diagnosis not present

## 2020-03-03 DIAGNOSIS — Z23 Encounter for immunization: Secondary | ICD-10-CM | POA: Diagnosis not present

## 2020-03-05 DIAGNOSIS — M1611 Unilateral primary osteoarthritis, right hip: Secondary | ICD-10-CM | POA: Diagnosis not present

## 2020-03-05 DIAGNOSIS — M25551 Pain in right hip: Secondary | ICD-10-CM | POA: Diagnosis not present

## 2020-04-09 DIAGNOSIS — M1611 Unilateral primary osteoarthritis, right hip: Secondary | ICD-10-CM | POA: Diagnosis not present

## 2020-04-09 DIAGNOSIS — M7061 Trochanteric bursitis, right hip: Secondary | ICD-10-CM | POA: Diagnosis not present

## 2020-04-11 ENCOUNTER — Encounter: Payer: Self-pay | Admitting: Internal Medicine

## 2020-04-13 NOTE — Progress Notes (Signed)
Subjective:    Patient ID: Kelsey Dominguez, female    DOB: December 27, 1945, 74 y.o.   MRN: [id number]  HPI The patient is here for an acute visit.   Right ankle pain - for a couple or few weeks she has had medial ankle pain.  There was no injury.  There is mild swelling and pain with palpation on the medial malleolus.  She has no pain with walking.      She fell two days ago and injured her right wrist and landed on her back.  She thinks she just bruised her back and it will improve with time.  Her right wrist is swollen and very painful.  Especially with twisting it.  She denies N/T.  She has less strength in her fingers.    Medications and allergies reviewed with patient and updated if appropriate.  Patient Active Problem List   Diagnosis Date Noted  . Glaucoma 08/08/2019  . Vitamin D deficiency 08/08/2019  . Stenosis of left subclavian artery ([place]) 06/30/2018  . Left carotid bruit 06/20/2018  . Hyperlipidemia 06/20/2018  . AC joint arthropathy 03/13/2016  . Osteopenia 03/13/2016  . Performance anxiety 03/13/2016  . White coat syndrome without diagnosis of hypertension 03/13/2016  . Osteoarthritis, hip, bilateral 11/25/2015    Current Outpatient Medications on File Prior to Visit  Medication Sig Dispense Refill  . calcium-vitamin D (OSCAL WITH D) 500-200 MG-UNIT tablet Take 1 tablet by mouth.    . Cholecalciferol (VITAMIN D3) 2000 units TABS Take 4,000 Units by mouth daily.     . Glucosamine-Chondroit-Vit C-Mn (GLUCOSAMINE 1500 COMPLEX PO) Take 1,500 mg by mouth.    . TURMERIC PO Take 1,000 mg by mouth.    . vitamin B-12 (CYANOCOBALAMIN) 1000 MCG tablet Take 1,000 mcg by mouth daily.    . celecoxib (CELEBREX) 200 MG capsule Take 1 capsule (200 mg total) by mouth 2 (two) times daily as needed. (Patient not taking: Reported on 04/14/2020) 60 capsule 6   No current facility-administered medications on file prior to visit.    History reviewed. No pertinent past medical  history.  Past Surgical History:  Procedure Laterality Date  . LIPOSUCTION HEAD / NECK     chin and neck    Social History   Socioeconomic History  . Marital status: Married    Spouse name: Not on file  . Number of children: Not on file  . Years of education: Not on file  . Highest education level: Not on file  Occupational History  . Not on file  Tobacco Use  . Smoking status: Former Research scientist (life sciences)  . Smokeless tobacco: Never Used  Vaping Use  . Vaping Use: Never used  Substance and Sexual Activity  . Alcohol use: Yes    Comment: weekends only  . Drug use: No  . Sexual activity: Not on file  Other Topics Concern  . Not on file  Social History Narrative   Walks 2-3 times a week   Social Determinants of Health   Financial Resource Strain:   . Difficulty of Paying Living Expenses: Not on file  Food Insecurity:   . Worried About Charity fundraiser in the Last Year: Not on file  . Ran Out of Food in the Last Year: Not on file  Transportation Needs:   . Lack of Transportation (Medical): Not on file  . Lack of Transportation (Non-Medical): Not on file  Physical Activity:   . Days of Exercise per Week: Not on file  .  Minutes of Exercise per Session: Not on file  Stress:   . Feeling of Stress : Not on file  Social Connections:   . Frequency of Communication with Friends and Family: Not on file  . Frequency of Social Gatherings with Friends and Family: Not on file  . Attends Religious Services: Not on file  . Active Member of Clubs or Organizations: Not on file  . Attends Archivist Meetings: Not on file  . Marital Status: Not on file    Family History  Problem Relation Age of Onset  . Lung cancer Mother   . Alzheimer's disease Father   . Heart disease Father   . Heart disease Brother        s/p stent  . Stroke Maternal Grandfather     Review of Systems  Constitutional: Negative for fever.  Musculoskeletal: Positive for arthralgias, back pain and joint  swelling.  Neurological: Negative for numbness. Weakness: right fingers.       Objective:   Vitals:   04/14/20 1435  BP: 136/84  Pulse: 73  Temp: 98.2 F (36.8 C)  SpO2: 95%   BP Readings from Last 3 Encounters:  04/14/20 136/84  08/08/19 140/78  12/18/18 (!) 155/85   Wt Readings from Last 3 Encounters:  04/14/20 141 lb 3.2 oz (64 kg)  08/08/19 139 lb 6.4 oz (63.2 kg)  12/18/18 135 lb 8 oz (61.5 kg)   Body mass index is 25.83 kg/m.   Physical Exam Constitutional:      General: She is not in acute distress.    Appearance: Normal appearance. She is not ill-appearing.  HENT:     Head: Normocephalic and atraumatic.  Musculoskeletal:     Comments: Right medial malleolus - tenderness with palpation of posterior aspect, minimal swelling.  FROM w/o pain,  No achilles pain.  No pain in foot/lower leg  Right wrist with moderate swelling, mild bruising, FROM but with pain esp with supination/prontation.    Neurological:     Mental Status: She is alert.     Sensory: No sensory deficit.            Assessment & Plan:    See Problem List for Assessment and Plan of chronic medical problems.    This visit occurred during the SARS-CoV-2 public health emergency.  Safety protocols were in place, including screening questions prior to the visit, additional usage of staff PPE, and extensive cleaning of exam room while observing appropriate contact time as indicated for disinfecting solutions.

## 2020-04-14 ENCOUNTER — Other Ambulatory Visit: Payer: Self-pay

## 2020-04-14 ENCOUNTER — Ambulatory Visit (INDEPENDENT_AMBULATORY_CARE_PROVIDER_SITE_OTHER): Payer: Medicare Other | Admitting: Internal Medicine

## 2020-04-14 ENCOUNTER — Encounter: Payer: Self-pay | Admitting: Internal Medicine

## 2020-04-14 ENCOUNTER — Ambulatory Visit (INDEPENDENT_AMBULATORY_CARE_PROVIDER_SITE_OTHER): Payer: Medicare Other

## 2020-04-14 VITALS — BP 136/84 | HR 73 | Temp 98.2°F | Ht 62.0 in | Wt 141.2 lb

## 2020-04-14 DIAGNOSIS — M25531 Pain in right wrist: Secondary | ICD-10-CM

## 2020-04-14 DIAGNOSIS — Z043 Encounter for examination and observation following other accident: Secondary | ICD-10-CM | POA: Diagnosis not present

## 2020-04-14 DIAGNOSIS — M25571 Pain in right ankle and joints of right foot: Secondary | ICD-10-CM | POA: Diagnosis not present

## 2020-04-14 NOTE — Patient Instructions (Signed)
Have an xray downstairs of your wrist.  We will call you with the results.

## 2020-04-14 NOTE — Assessment & Plan Note (Signed)
Acute Feel two days ago Concern for possible fx - will obtain xray today Treatment depending on xray results

## 2020-04-14 NOTE — Assessment & Plan Note (Signed)
Acute Started 2-3 weeks ago w/o injury Localized pain and minimal swelling R medial malleolus No pain with walking ? Tendinitis, ligament inflammation Advised ice, elevate, voltaren gel If no improvement should see ortho or sports med

## 2020-05-05 NOTE — H&P (Signed)
TOTAL HIP ADMISSION H&P  Patient is admitted for right total hip arthroplasty, anterior approach.  Subjective:  Chief Complaint: Right hip OA / pain  HPI: Kelsey Dominguez, 74 y.o. female, has a history of pain and functional disability in the right hip(s) due to arthritis and patient has failed non-surgical conservative treatments for greater than 12 weeks to include NSAID's and/or analgesics, corticosteriod injections and activity modification.  Onset of symptoms was gradual starting <1 year ago with rapidlly worsening course since that time.The patient noted no past surgery on the right hip(s).  Patient currently rates pain in the right hip at 7 out of 10 with activity. Patient has night pain, worsening of pain with activity and weight bearing, trendelenberg gait, pain that interfers with activities of daily living and pain with passive range of motion. Patient has evidence of periarticular osteophytes and joint space narrowing by imaging studies. This condition presents safety issues increasing the risk of falls.  There is no current active infection.  Risks, benefits and expectations were discussed with the patient.  Risks including but not limited to the risk of anesthesia, blood clots, nerve damage, blood vessel damage, failure of the prosthesis, infection and up to and including death.  Patient understand the risks, benefits and expectations and wishes to proceed with surgery.    D/C Plans:       Home   Post-op Meds:       No Rx given   Tranexamic Acid:      To be given - IV   Decadron:      Is to be given  FYI:      ASA  Norco  DME:   Pt equipment arranged  PT:   [facility]  Pharmacy: [facility] [place]    Patient Active Problem List   Diagnosis Date Noted  . Wrist pain, acute, right 04/14/2020  . Right ankle pain 04/14/2020  . Glaucoma 08/08/2019  . Vitamin D deficiency 08/08/2019  . Stenosis of left subclavian artery ([place]) 06/30/2018  . Left carotid bruit 06/20/2018  .  Hyperlipidemia 06/20/2018  . AC joint arthropathy 03/13/2016  . Osteopenia 03/13/2016  . Performance anxiety 03/13/2016  . White coat syndrome without diagnosis of hypertension 03/13/2016  . Osteoarthritis, hip, bilateral 11/25/2015   No past medical history on file.  Past Surgical History:  Procedure Laterality Date  . LIPOSUCTION HEAD / NECK     chin and neck    No current facility-administered medications for this encounter.   Current Outpatient Medications  Medication Sig Dispense Refill Last Dose  . calcium-vitamin D (OSCAL WITH D) 500-200 MG-UNIT tablet Take 1 tablet by mouth.     . celecoxib (CELEBREX) 200 MG capsule Take 1 capsule (200 mg total) by mouth 2 (two) times daily as needed. (Patient not taking: Reported on 04/14/2020) 60 capsule 6   . Cholecalciferol (VITAMIN D3) 2000 units TABS Take 4,000 Units by mouth daily.      . Glucosamine-Chondroit-Vit C-Mn (GLUCOSAMINE 1500 COMPLEX PO) Take 1,500 mg by mouth.     . TURMERIC PO Take 1,000 mg by mouth.     . vitamin B-12 (CYANOCOBALAMIN) 1000 MCG tablet Take 1,000 mcg by mouth daily.      No Known Allergies   Social History   Tobacco Use  . Smoking status: Former Research scientist (life sciences)  . Smokeless tobacco: Never Used  Substance Use Topics  . Alcohol use: Yes    Comment: weekends only    Family History  Problem Relation Age  of Onset  . Lung cancer Mother   . Alzheimer's disease Father   . Heart disease Father   . Heart disease Brother        s/p stent  . Stroke Maternal Grandfather      Review of Systems  Constitutional: Negative.   HENT: Negative.   Eyes: Negative.   Respiratory: Negative.   Cardiovascular: Negative.   Gastrointestinal: Negative.   Genitourinary: Negative.   Musculoskeletal: Positive for joint pain.  Skin: Negative.   Neurological: Negative.   Endo/Heme/Allergies: Negative.   Psychiatric/Behavioral: Negative.       Objective:  Physical Exam Constitutional:      Appearance: She is  well-developed.  HENT:     Head: Normocephalic.  Eyes:     Pupils: Pupils are equal, round, and reactive to light.  Neck:     Thyroid: No thyromegaly.     Vascular: No JVD.     Trachea: No tracheal deviation.  Cardiovascular:     Rate and Rhythm: Normal rate and regular rhythm.     Pulses: Intact distal pulses.     Heart sounds: Murmur (MVP) heard.    Pulmonary:     Effort: Pulmonary effort is normal. No respiratory distress.     Breath sounds: Normal breath sounds. No wheezing.  Abdominal:     Palpations: Abdomen is soft.     Tenderness: There is no abdominal tenderness. There is no guarding.  Musculoskeletal:     Cervical back: Neck supple.  Lymphadenopathy:     Cervical: No cervical adenopathy.  Skin:    General: Skin is warm and dry.  Neurological:     Mental Status: She is alert and oriented to person, place, and time.  Psychiatric:        Mood and Affect: Mood and affect normal.     Vital signs in last 24 hours: BP: ()/()  Arterial Line BP: ()/()   Labs:   Estimated body mass index is 25.83 kg/m as calculated from the following:   Height as of 04/14/20: 5\' 2"  (1.575 m).   Weight as of 04/14/20: 64 kg.   Imaging Review Plain radiographs demonstrate severe degenerative joint disease of the right hip(s). The bone quality appears to be good for age and reported activity level.      Assessment/Plan:  End stage arthritis, right hip  The patient history, physical examination, clinical judgement of the provider and imaging studies are consistent with end stage degenerative joint disease of the right hip(s) and total hip arthroplasty is deemed medically necessary. The treatment options including medical management, injection therapy, arthroscopy and arthroplasty were discussed at length. The risks and benefits of total hip arthroplasty were presented and reviewed. The risks due to aseptic loosening, infection, stiffness, dislocation/subluxation,  thromboembolic  complications and other imponderables were discussed.  The patient acknowledged the explanation, agreed to proceed with the plan and consent was signed. Patient is being admitted for inpatient treatment for surgery, pain control, PT, OT, prophylactic antibiotics, VTE prophylaxis, progressive ambulation and ADL's and discharge planning.The patient is planning to be discharged home.

## 2020-05-22 NOTE — Patient Instructions (Addendum)
DUE TO COVID-19 ONLY ONE VISITOR IS ALLOWED TO COME WITH YOU AND STAY IN THE WAITING ROOM ONLY DURING PRE OP AND PROCEDURE DAY OF SURGERY. THE 1 VISITOR  MAY VISIT WITH YOU AFTER SURGERY IN YOUR PRIVATE ROOM DURING VISITING HOURS ONLY!  YOU NEED TO HAVE A COVID 19 TEST ON: 05/31/20 @ 11:00 AM , THIS TEST MUST BE DONE BEFORE SURGERY,  COVID TESTING SITE [place] [address], IT IS ON THE RIGHT GOING OUT [address] APPROXIMATELY  2 MINUTES PAST [employer] ON THE RIGHT. ONCE YOUR COVID TEST IS COMPLETED,  PLEASE BEGIN THE QUARANTINE INSTRUCTIONS AS OUTLINED IN YOUR HANDOUT.                Kelsey Dominguez   Your procedure is scheduled on: 06/03/20   Report to [facility] Main  Entrance   Report to admitting at: 6:10 AM     Call this number if you have problems the morning of surgery [phone]    Remember:    NO SOLID FOOD AFTER MIDNIGHT THE NIGHT PRIOR TO SURGERY. NOTHING BY MOUTH EXCEPT CLEAR LIQUIDS UNTIL: 5:40 AM . PLEASE FINISH ENSURE DRINK PER SURGEON ORDER  WHICH NEEDS TO BE COMPLETED AT: 5:40 AM .  CLEAR LIQUID DIET   Foods Allowed                                                                     Foods Excluded  Coffee and tea, regular and decaf                             liquids that you cannot  Plain Jell-O any favor except red or purple                                           see through such as: Fruit ices (not with fruit pulp)                                     milk, soups, orange juice  Iced Popsicles                                    All solid food Carbonated beverages, regular and diet                                    Cranberry, grape and apple juices Sports drinks like Gatorade Lightly seasoned clear broth or consume(fat free) Sugar, honey syrup  Sample Menu Breakfast                                Lunch  Supper Cranberry juice                    Beef broth                             Chicken broth Jell-O                                     Grape juice                           Apple juice Coffee or tea                        Jell-O                                      Popsicle                                                Coffee or tea                        Coffee or tea  _____________________________________________________________________   BRUSH YOUR TEETH MORNING OF SURGERY AND RINSE YOUR MOUTH OUT, NO CHEWING GUM CANDY OR MINTS.                               You may not have any metal on your body including hair pins and              piercings  Do not wear jewelry, make-up, lotions, powders or perfumes, deodorant             Do not wear nail polish on your fingernails.  Do not shave  48 hours prior to surgery.    Do not bring valuables to the hospital. [place].  Contacts, dentures or bridgework may not be worn into surgery.  Leave suitcase in the car. After surgery it may be brought to your room.     Patients discharged the day of surgery will not be allowed to drive home. IF YOU ARE HAVING SURGERY AND GOING HOME THE SAME DAY, YOU MUST HAVE AN ADULT TO DRIVE YOU HOME AND BE WITH YOU FOR 24 HOURS. YOU MAY GO HOME BY TAXI OR UBER OR ORTHERWISE, BUT AN ADULT MUST ACCOMPANY YOU HOME AND STAY WITH YOU FOR 24 HOURS.  Name and phone number of your driver:  Special Instructions: N/A              Please read over the following fact sheets you were given: _____________________________________________________________________         [facility] - Preparing for Surgery Before surgery, you can play an important role.  Because skin is not sterile, your skin needs to be as free of germs as possible.  You can reduce the number of germs on your skin by washing with CHG (chlorahexidine gluconate) soap before  surgery.  CHG is an antiseptic cleaner which kills germs and bonds with the skin to continue killing germs even after  washing. Please DO NOT use if you have an allergy to CHG or antibacterial soaps.  If your skin becomes reddened/irritated stop using the CHG and inform your nurse when you arrive at Short Stay. Do not shave (including legs and underarms) for at least 48 hours prior to the first CHG shower.  You may shave your face/neck. Please follow these instructions carefully:  1.  Shower with CHG Soap the night before surgery and the  morning of Surgery.  2.  If you choose to wash your hair, wash your hair first as usual with your  normal  shampoo.  3.  After you shampoo, rinse your hair and body thoroughly to remove the  shampoo.                           4.  Use CHG as you would any other liquid soap.  You can apply chg directly  to the skin and wash                       Gently with a scrungie or clean washcloth.  5.  Apply the CHG Soap to your body ONLY FROM THE NECK DOWN.   Do not use on face/ open                           Wound or open sores. Avoid contact with eyes, ears mouth and genitals (private parts).                       Wash face,  Genitals (private parts) with your normal soap.             6.  Wash thoroughly, paying special attention to the area where your surgery  will be performed.  7.  Thoroughly rinse your body with warm water from the neck down.  8.  DO NOT shower/wash with your normal soap after using and rinsing off  the CHG Soap.                9.  Pat yourself dry with a clean towel.            10.  Wear clean pajamas.            11.  Place clean sheets on your bed the night of your first shower and do not  sleep with pets. Day of Surgery : Do not apply any lotions/deodorants the morning of surgery.  Please wear clean clothes to the hospital/surgery center.  FAILURE TO FOLLOW THESE INSTRUCTIONS MAY RESULT IN THE CANCELLATION OF YOUR SURGERY PATIENT SIGNATURE_________________________________  NURSE  SIGNATURE__________________________________  ________________________________________________________________________   Adam Phenix  An incentive spirometer is a tool that can help keep your lungs clear and active. This tool measures how well you are filling your lungs with each breath. Taking long deep breaths may help reverse or decrease the chance of developing breathing (pulmonary) problems (especially infection) following:  A long period of time when you are unable to move or be active. BEFORE THE PROCEDURE   If the spirometer includes an indicator to show your best effort, your nurse or respiratory therapist will set it to a desired goal.  If possible, sit up straight or lean slightly forward. Try not  to slouch.  Hold the incentive spirometer in an upright position. INSTRUCTIONS FOR USE  1. Sit on the edge of your bed if possible, or sit up as far as you can in bed or on a chair. 2. Hold the incentive spirometer in an upright position. 3. Breathe out normally. 4. Place the mouthpiece in your mouth and seal your lips tightly around it. 5. Breathe in slowly and as deeply as possible, raising the piston or the ball toward the top of the column. 6. Hold your breath for 3-5 seconds or for as long as possible. Allow the piston or ball to fall to the bottom of the column. 7. Remove the mouthpiece from your mouth and breathe out normally. 8. Rest for a few seconds and repeat Steps 1 through 7 at least 10 times every 1-2 hours when you are awake. Take your time and take a few normal breaths between deep breaths. 9. The spirometer may include an indicator to show your best effort. Use the indicator as a goal to work toward during each repetition. 10. After each set of 10 deep breaths, practice coughing to be sure your lungs are clear. If you have an incision (the cut made at the time of surgery), support your incision when coughing by placing a pillow or rolled up towels firmly  against it. Once you are able to get out of bed, walk around indoors and cough well. You may stop using the incentive spirometer when instructed by your caregiver.  RISKS AND COMPLICATIONS  Take your time so you do not get dizzy or light-headed.  If you are in pain, you may need to take or ask for pain medication before doing incentive spirometry. It is harder to take a deep breath if you are having pain. AFTER USE  Rest and breathe slowly and easily.  It can be helpful to keep track of a log of your progress. Your caregiver can provide you with a simple table to help with this. If you are using the spirometer at home, follow these instructions: [place] IF:   You are having difficultly using the spirometer.  You have trouble using the spirometer as often as instructed.  Your pain medication is not giving enough relief while using the spirometer.  You develop fever of 100.5 F (38.1 C) or higher. SEEK IMMEDIATE MEDICAL CARE IF:   You cough up bloody sputum that had not been present before.  You develop fever of 102 F (38.9 C) or greater.  You develop worsening pain at or near the incision site. MAKE SURE YOU:   Understand these instructions.  Will watch your condition.  Will get help right away if you are not doing well or get worse. Document Released: 09/06/2006 Document Revised: 07/19/2011 Document Reviewed: 11/07/2006 [facility] Patient Information 2014 [place], [place].   ________________________________________________________________________

## 2020-05-23 ENCOUNTER — Encounter (HOSPITAL_COMMUNITY)
Admission: RE | Admit: 2020-05-23 | Discharge: 2020-05-23 | Disposition: A | Discharge status: Home / Self Care | Payer: Medicare Other | Source: Ambulatory Visit | Attending: Orthopedic Surgery | Admitting: Orthopedic Surgery

## 2020-05-23 ENCOUNTER — Other Ambulatory Visit: Payer: Self-pay

## 2020-05-23 ENCOUNTER — Encounter (HOSPITAL_COMMUNITY): Payer: Self-pay

## 2020-05-23 DIAGNOSIS — Z01818 Encounter for other preprocedural examination: Secondary | ICD-10-CM | POA: Insufficient documentation

## 2020-05-23 HISTORY — DX: Unspecified atherosclerosis of native arteries of extremities, other extremity: I70.208

## 2020-05-23 HISTORY — DX: Nonrheumatic mitral (valve) prolapse: I34.1

## 2020-05-23 HISTORY — DX: Unspecified osteoarthritis, unspecified site: M19.90

## 2020-05-23 LAB — CBC
HCT: 42.5 % (ref 36.0–46.0)
Hemoglobin: 13.9 g/dL (ref 12.0–15.0)
MCH: 31 pg (ref 26.0–34.0)
MCHC: 32.7 g/dL (ref 30.0–36.0)
MCV: 94.9 fL (ref 80.0–100.0)
Platelets: 298 10*3/uL (ref 150–400)
RBC: 4.48 MIL/uL (ref 3.87–5.11)
RDW: 14 % (ref 11.5–15.5)
WBC: 6.5 10*3/uL (ref 4.0–10.5)
nRBC: 0 % (ref 0.0–0.2)

## 2020-05-23 LAB — BASIC METABOLIC PANEL
Anion gap: 8 (ref 5–15)
BUN: 20 mg/dL (ref 8–23)
CO2: 24 mmol/L (ref 22–32)
Calcium: 9.2 mg/dL (ref 8.9–10.3)
Chloride: 107 mmol/L (ref 98–111)
Creatinine, Ser: 0.67 mg/dL (ref 0.44–1.00)
GFR, Estimated: >60 mL/min (ref >60)
Glucose, Bld: 89 mg/dL (ref 70–99)
Potassium: 4.6 mmol/L (ref 3.5–5.1)
Sodium: 139 mmol/L (ref 135–145)

## 2020-05-23 LAB — SURGICAL PCR SCREEN
MRSA, PCR: NEGATIVE
Staphylococcus aureus: POSITIVE — AB

## 2020-05-23 LAB — TYPE AND SCREEN
ABO/RH(D): O POS
Antibody Screen: NEGATIVE

## 2020-05-23 NOTE — Progress Notes (Signed)
PCR:  positive [facility] 

## 2020-05-23 NOTE — Progress Notes (Signed)
COVID Vaccine Completed: Yes Date COVID Vaccine completed: 12/06/19 COVID vaccine manufacturer: Pfizer       PCP - Dr. Celso Amy  Cardiologist -   Chest x-ray -  EKG -  Stress Test -  ECHO -  Cardiac Cath -  Pacemaker/ICD device last checked:  Sleep Study -  CPAP -   Fasting Blood Sugar -  Checks Blood Sugar _____ times a day  Blood Thinner Instructions: Aspirin Instructions: Last Dose:  Anesthesia review: Hx: Mitral valve prolapse  Patient denies shortness of breath, fever, cough and chest pain at PAT appointment   Patient verbalized understanding of instructions that were given to them at the PAT appointment. Patient was also instructed that they will need to review over the PAT instructions again at home before surgery.

## 2020-05-30 ENCOUNTER — Other Ambulatory Visit (HOSPITAL_COMMUNITY): Payer: Medicare Other

## 2020-05-31 ENCOUNTER — Other Ambulatory Visit (HOSPITAL_COMMUNITY)
Admission: RE | Admit: 2020-05-31 | Discharge: 2020-05-31 | Disposition: A | Discharge status: Home / Self Care | Payer: Medicare Other | Source: Ambulatory Visit | Attending: Orthopedic Surgery | Admitting: Orthopedic Surgery

## 2020-05-31 DIAGNOSIS — Z20822 Contact with and (suspected) exposure to covid-19: Secondary | ICD-10-CM | POA: Diagnosis not present

## 2020-05-31 DIAGNOSIS — Z01812 Encounter for preprocedural laboratory examination: Secondary | ICD-10-CM | POA: Insufficient documentation

## 2020-05-31 LAB — SARS CORONAVIRUS 2 (TAT 6-24 HRS): SARS Coronavirus 2: NEGATIVE

## 2020-06-03 ENCOUNTER — Encounter (HOSPITAL_COMMUNITY)
Admission: RE | Disposition: A | Discharge status: Home / Self Care | Payer: Self-pay | Source: Home / Self Care | Attending: Orthopedic Surgery

## 2020-06-03 ENCOUNTER — Ambulatory Visit (HOSPITAL_COMMUNITY): Payer: Medicare Other

## 2020-06-03 ENCOUNTER — Ambulatory Visit (HOSPITAL_COMMUNITY): Payer: Medicare Other | Admitting: Anesthesiology

## 2020-06-03 ENCOUNTER — Ambulatory Visit (HOSPITAL_COMMUNITY)
Admission: RE | Admit: 2020-06-03 | Discharge: 2020-06-03 | Disposition: A | Discharge status: Home / Self Care | Payer: Medicare Other | Attending: Orthopedic Surgery | Admitting: Orthopedic Surgery

## 2020-06-03 ENCOUNTER — Encounter (HOSPITAL_COMMUNITY): Payer: Self-pay | Admitting: Orthopedic Surgery

## 2020-06-03 DIAGNOSIS — Z20822 Contact with and (suspected) exposure to covid-19: Secondary | ICD-10-CM | POA: Insufficient documentation

## 2020-06-03 DIAGNOSIS — R262 Difficulty in walking, not elsewhere classified: Secondary | ICD-10-CM | POA: Diagnosis not present

## 2020-06-03 DIAGNOSIS — R2689 Other abnormalities of gait and mobility: Secondary | ICD-10-CM | POA: Diagnosis not present

## 2020-06-03 DIAGNOSIS — Z419 Encounter for procedure for purposes other than remedying health state, unspecified: Secondary | ICD-10-CM

## 2020-06-03 DIAGNOSIS — Z96649 Presence of unspecified artificial hip joint: Secondary | ICD-10-CM

## 2020-06-03 DIAGNOSIS — Z471 Aftercare following joint replacement surgery: Secondary | ICD-10-CM | POA: Diagnosis not present

## 2020-06-03 DIAGNOSIS — M1611 Unilateral primary osteoarthritis, right hip: Secondary | ICD-10-CM | POA: Insufficient documentation

## 2020-06-03 DIAGNOSIS — E785 Hyperlipidemia, unspecified: Secondary | ICD-10-CM | POA: Diagnosis not present

## 2020-06-03 DIAGNOSIS — E559 Vitamin D deficiency, unspecified: Secondary | ICD-10-CM | POA: Diagnosis not present

## 2020-06-03 DIAGNOSIS — Z87891 Personal history of nicotine dependence: Secondary | ICD-10-CM | POA: Diagnosis not present

## 2020-06-03 DIAGNOSIS — M25551 Pain in right hip: Secondary | ICD-10-CM | POA: Diagnosis not present

## 2020-06-03 DIAGNOSIS — Z96641 Presence of right artificial hip joint: Secondary | ICD-10-CM | POA: Diagnosis not present

## 2020-06-03 DIAGNOSIS — H409 Unspecified glaucoma: Secondary | ICD-10-CM | POA: Diagnosis not present

## 2020-06-03 DIAGNOSIS — D259 Leiomyoma of uterus, unspecified: Secondary | ICD-10-CM | POA: Diagnosis not present

## 2020-06-03 DIAGNOSIS — M1612 Unilateral primary osteoarthritis, left hip: Secondary | ICD-10-CM | POA: Diagnosis not present

## 2020-06-03 HISTORY — PX: TOTAL HIP ARTHROPLASTY: SHX124

## 2020-06-03 LAB — ABO/RH: ABO/RH(D): O POS

## 2020-06-03 LAB — SARS CORONAVIRUS 2 (TAT 6-24 HRS): SARS Coronavirus 2: NEGATIVE

## 2020-06-03 LAB — SARS CORONAVIRUS 2 BY RT PCR (HOSPITAL ORDER, PERFORMED IN ~~LOC~~ HOSPITAL LAB): SARS Coronavirus 2: NEGATIVE

## 2020-06-03 SURGERY — ARTHROPLASTY, HIP, TOTAL, ANTERIOR APPROACH
Anesthesia: Spinal | Site: Hip | Laterality: Right

## 2020-06-03 MED ORDER — METHOCARBAMOL 500 MG PO TABS: 500.0000 mg | ORAL_TABLET | Freq: Four times a day (QID) | ORAL | 0 refills | Status: DC | PRN

## 2020-06-03 MED ORDER — FERROUS SULFATE 325 (65 FE) MG PO TABS: 325.0000 mg | ORAL_TABLET | Freq: Three times a day (TID) | ORAL | Status: DC

## 2020-06-03 MED ORDER — PROPOFOL 500 MG/50ML IV EMUL
INTRAVENOUS | Status: AC
  Filled 2020-06-03: qty 50

## 2020-06-03 MED ORDER — METHOCARBAMOL 500 MG IVPB - SIMPLE MED: 500.0000 mg | Freq: Four times a day (QID) | INTRAVENOUS | Status: DC | PRN

## 2020-06-03 MED ORDER — PROPOFOL 10 MG/ML IV BOLUS
INTRAVENOUS | Status: DC | PRN
  Administered 2020-06-03: 20 mg via INTRAVENOUS
  Administered 2020-06-03: 30 mg via INTRAVENOUS

## 2020-06-03 MED ORDER — ONDANSETRON HCL 4 MG/2ML IJ SOLN: 4.0000 mg | Freq: Once | INTRAMUSCULAR | Status: DC | PRN

## 2020-06-03 MED ORDER — ONDANSETRON HCL 4 MG/2ML IJ SOLN
INTRAMUSCULAR | Status: DC | PRN
  Administered 2020-06-03: 4 mg via INTRAVENOUS

## 2020-06-03 MED ORDER — POLYETHYLENE GLYCOL 3350 17 G PO PACK: 17.0000 g | PACK | Freq: Two times a day (BID) | ORAL | 0 refills | Status: DC

## 2020-06-03 MED ORDER — OXYCODONE HCL 5 MG PO TABS
5.0000 mg | ORAL_TABLET | Freq: Once | ORAL | Status: AC | PRN
  Administered 2020-06-03: 5 mg via ORAL

## 2020-06-03 MED ORDER — DOCUSATE SODIUM 100 MG PO CAPS: 100.0000 mg | ORAL_CAPSULE | Freq: Two times a day (BID) | ORAL | Status: DC

## 2020-06-03 MED ORDER — HYDROCODONE-ACETAMINOPHEN 5-325 MG PO TABS: 1.0000 | ORAL_TABLET | ORAL | 0 refills | Status: DC | PRN

## 2020-06-03 MED ORDER — MEPIVACAINE HCL (PF) 2 % IJ SOLN
INTRAMUSCULAR | Status: AC
  Filled 2020-06-03: qty 20

## 2020-06-03 MED ORDER — DEXAMETHASONE SODIUM PHOSPHATE 10 MG/ML IJ SOLN
INTRAMUSCULAR | Status: AC
  Filled 2020-06-03: qty 1

## 2020-06-03 MED ORDER — LACTATED RINGERS IV BOLUS
500.0000 mL | Freq: Once | INTRAVENOUS | Status: AC
  Administered 2020-06-03: 500 mL via INTRAVENOUS

## 2020-06-03 MED ORDER — ASPIRIN 81 MG PO CHEW: 81.0000 mg | CHEWABLE_TABLET | Freq: Two times a day (BID) | ORAL | 0 refills | Status: AC

## 2020-06-03 MED ORDER — CHLORHEXIDINE GLUCONATE 0.12 % MT SOLN
15.0000 mL | Freq: Once | OROMUCOSAL | Status: AC
  Administered 2020-06-03: 15 mL via OROMUCOSAL

## 2020-06-03 MED ORDER — OXYCODONE HCL 5 MG PO TABS
ORAL_TABLET | ORAL | Status: AC
  Filled 2020-06-03: qty 1

## 2020-06-03 MED ORDER — DEXAMETHASONE SODIUM PHOSPHATE 10 MG/ML IJ SOLN
10.0000 mg | Freq: Once | INTRAMUSCULAR | Status: AC
  Administered 2020-06-03: 10 mg via INTRAVENOUS

## 2020-06-03 MED ORDER — PROPOFOL 500 MG/50ML IV EMUL
INTRAVENOUS | Status: DC | PRN
  Administered 2020-06-03: 100 ug/kg/min via INTRAVENOUS

## 2020-06-03 MED ORDER — METHOCARBAMOL 500 MG PO TABS: 500.0000 mg | ORAL_TABLET | Freq: Four times a day (QID) | ORAL | Status: DC | PRN

## 2020-06-03 MED ORDER — ORAL CARE MOUTH RINSE: 15.0000 mL | Freq: Once | OROMUCOSAL | Status: AC

## 2020-06-03 MED ORDER — EPHEDRINE SULFATE-NACL 50-0.9 MG/10ML-% IV SOSY
PREFILLED_SYRINGE | INTRAVENOUS | Status: DC | PRN
  Administered 2020-06-03: 15 mg via INTRAVENOUS
  Administered 2020-06-03: 10 mg via INTRAVENOUS

## 2020-06-03 MED ORDER — LACTATED RINGERS IV SOLN: INTRAVENOUS | Status: DC

## 2020-06-03 MED ORDER — MEPIVACAINE HCL (PF) 2 % IJ SOLN
INTRAMUSCULAR | Status: DC | PRN
  Administered 2020-06-03: 3 mL via INTRATHECAL

## 2020-06-03 MED ORDER — CEFAZOLIN SODIUM-DEXTROSE 2-4 GM/100ML-% IV SOLN
2.0000 g | INTRAVENOUS | Status: AC
  Administered 2020-06-03: 2 g via INTRAVENOUS
  Filled 2020-06-03: qty 100

## 2020-06-03 MED ORDER — STERILE WATER FOR IRRIGATION IR SOLN
Status: DC | PRN
  Administered 2020-06-03: 2000 mL

## 2020-06-03 MED ORDER — FENTANYL CITRATE (PF) 100 MCG/2ML IJ SOLN: 25.0000 ug | INTRAMUSCULAR | Status: DC | PRN

## 2020-06-03 MED ORDER — OXYCODONE HCL 5 MG/5ML PO SOLN: 5.0000 mg | Freq: Once | ORAL | Status: AC | PRN

## 2020-06-03 MED ORDER — SODIUM CHLORIDE 0.9 % IR SOLN
Status: DC | PRN
  Administered 2020-06-03: 1000 mL

## 2020-06-03 MED ORDER — LACTATED RINGERS IV BOLUS
250.0000 mL | Freq: Once | INTRAVENOUS | Status: AC
  Administered 2020-06-03: 250 mL via INTRAVENOUS

## 2020-06-03 MED ORDER — ACETAMINOPHEN 500 MG PO TABS
1000.0000 mg | ORAL_TABLET | Freq: Once | ORAL | Status: AC
  Administered 2020-06-03: 1000 mg via ORAL
  Filled 2020-06-03: qty 2

## 2020-06-03 MED ORDER — AMISULPRIDE (ANTIEMETIC) 5 MG/2ML IV SOLN: 10.0000 mg | Freq: Once | INTRAVENOUS | Status: DC | PRN

## 2020-06-03 MED ORDER — TRANEXAMIC ACID-NACL 1000-0.7 MG/100ML-% IV SOLN
1000.0000 mg | Freq: Once | INTRAVENOUS | Status: AC
  Administered 2020-06-03: 1000 mg via INTRAVENOUS

## 2020-06-03 MED ORDER — PHENYLEPHRINE HCL-NACL 10-0.9 MG/250ML-% IV SOLN
INTRAVENOUS | Status: DC | PRN
  Administered 2020-06-03: 25 ug/min via INTRAVENOUS

## 2020-06-03 MED ORDER — TRANEXAMIC ACID-NACL 1000-0.7 MG/100ML-% IV SOLN
1000.0000 mg | INTRAVENOUS | Status: AC
  Administered 2020-06-03: 1000 mg via INTRAVENOUS
  Filled 2020-06-03: qty 100

## 2020-06-03 MED ORDER — TRANEXAMIC ACID-NACL 1000-0.7 MG/100ML-% IV SOLN
INTRAVENOUS | Status: AC
  Filled 2020-06-03: qty 100

## 2020-06-03 MED ORDER — ONDANSETRON HCL 4 MG/2ML IJ SOLN
INTRAMUSCULAR | Status: AC
  Filled 2020-06-03: qty 2

## 2020-06-03 SURGICAL SUPPLY — 48 items
BAG DECANTER FOR FLEXI CONT (MISCELLANEOUS) IMPLANT
BAG ZIPLOCK 12X15 (MISCELLANEOUS) IMPLANT
BLADE SAG 18X100X1.27 (BLADE) ×2 IMPLANT
BLADE SURG SZ10 CARB STEEL (BLADE) ×4 IMPLANT
COVER PERINEAL POST (MISCELLANEOUS) ×2 IMPLANT
COVER SURGICAL LIGHT HANDLE (MISCELLANEOUS) ×2 IMPLANT
COVER WAND RF STERILE (DRAPES) IMPLANT
CUP ACET PINNACLE SECTR 50MM (Hips) ×1 IMPLANT
DERMABOND ADVANCED (GAUZE/BANDAGES/DRESSINGS) ×1
DERMABOND ADVANCED .7 DNX12 (GAUZE/BANDAGES/DRESSINGS) ×1 IMPLANT
DRAPE STERI IOBAN 125X83 (DRAPES) ×2 IMPLANT
DRAPE U-SHAPE 47X51 STRL (DRAPES) ×4 IMPLANT
DRESSING AQUACEL AG SP 3.5X10 (GAUZE/BANDAGES/DRESSINGS) ×1 IMPLANT
DRSG AQUACEL AG ADV 3.5X10 (GAUZE/BANDAGES/DRESSINGS) ×2 IMPLANT
DRSG AQUACEL AG SP 3.5X10 (GAUZE/BANDAGES/DRESSINGS) ×2
DURAPREP 26ML APPLICATOR (WOUND CARE) ×2 IMPLANT
ELECT REM PT RETURN 15FT ADLT (MISCELLANEOUS) ×2 IMPLANT
ELIMINATOR HOLE APEX DEPUY (Hips) ×2 IMPLANT
GLOVE BIOGEL PI IND STRL 8.5 (GLOVE) ×1 IMPLANT
GLOVE BIOGEL PI INDICATOR 8.5 (GLOVE) ×1
GLOVE ECLIPSE 8.0 STRL XLNG CF (GLOVE) ×4 IMPLANT
GLOVE ORTHO TXT STRL SZ7.5 (GLOVE) ×4 IMPLANT
GLOVE SURG ENC MOIS LTX SZ6 (GLOVE) ×4 IMPLANT
GLOVE SURG UNDER POLY LF SZ6.5 (GLOVE) ×2 IMPLANT
GLOVE SURG UNDER POLY LF SZ7.5 (GLOVE) ×2 IMPLANT
GOWN STRL REUS W/TWL LRG LVL3 (GOWN DISPOSABLE) ×4 IMPLANT
GOWN STRL REUS W/TWL XL LVL3 (GOWN DISPOSABLE) ×2 IMPLANT
HEAD FEMORAL 32 CERAMIC (Hips) ×2 IMPLANT
HOLDER FOLEY CATH W/STRAP (MISCELLANEOUS) ×2 IMPLANT
KIT TURNOVER KIT A (KITS) IMPLANT
LINER ACET PNNCL PLUS4 NEUTRAL (Hips) ×1 IMPLANT
PACK ANTERIOR HIP CUSTOM (KITS) ×2 IMPLANT
PENCIL SMOKE EVACUATOR (MISCELLANEOUS) IMPLANT
PINNACLE PLUS 4 NEUTRAL (Hips) ×2 IMPLANT
PINNACLE SECTOR CUP 50MM (Hips) ×2 IMPLANT
SCREW 6.5MMX30MM (Screw) ×2 IMPLANT
STEM FEM ACTIS STD SZ4 (Stem) ×2 IMPLANT
SUT MNCRL AB 4-0 PS2 18 (SUTURE) ×2 IMPLANT
SUT STRATAFIX 0 PDS 27 VIOLET (SUTURE) ×2
SUT VIC AB 1 CT1 27 (SUTURE) ×2
SUT VIC AB 1 CT1 27XBRD ANBCTR (SUTURE) ×1 IMPLANT
SUT VIC AB 1 CT1 36 (SUTURE) ×6 IMPLANT
SUT VIC AB 2-0 CT1 27 (SUTURE) ×4
SUT VIC AB 2-0 CT1 TAPERPNT 27 (SUTURE) ×2 IMPLANT
SUTURE STRATFX 0 PDS 27 VIOLET (SUTURE) ×1 IMPLANT
TRAY FOLEY MTR SLVR 16FR STAT (SET/KITS/TRAYS/PACK) IMPLANT
TUBE SUCTION HIGH CAP CLEAR NV (SUCTIONS) ×2 IMPLANT
WATER STERILE IRR 1000ML POUR (IV SOLUTION) ×2 IMPLANT

## 2020-06-03 NOTE — Op Note (Signed)
NAME:  Kelsey Dominguez                ACCOUNT NO.: [id number]      MEDICAL RECORD NO.: [id number]      FACILITY:  [facility]      PHYSICIAN:  Mauri Pole  DATE OF BIRTH:  06-01-1945     DATE OF PROCEDURE:  06/03/2020                                 OPERATIVE REPORT         PREOPERATIVE DIAGNOSIS: Right  hip osteoarthritis.      POSTOPERATIVE DIAGNOSIS:  Right hip osteoarthritis.      PROCEDURE:  Right total hip replacement through an anterior approach   utilizing DePuy THR system, component size 50 mm pinnacle cup, a size 32+4 neutral   Altrex liner, a size 4 standard Actis stem with a 32+1 delta ceramic   ball.      SURGEON:  Pietro Cassis. Alvan Dame, M.D.      ASSISTANT:  Danae Orleans, PA-C     ANESTHESIA:  Spinal.      SPECIMENS:  None.      COMPLICATIONS:  None.      BLOOD LOSS:  350 cc     DRAINS:  None.      INDICATION OF THE PROCEDURE:  Kelsey Dominguez is a 75 y.o. female who had   presented to office for evaluation of right hip pain.  Radiographs revealed   progressive degenerative changes with bone-on-bone   articulation of the  hip joint, including subchondral cystic changes and osteophytes.  The patient had painful limited range of   motion significantly affecting their overall quality of life and function.  The patient was failing to    respond to conservative measures including medications and/or injections and activity modification and at this point was ready   to proceed with more definitive measures.  Consent was obtained for   benefit of pain relief.  Specific risks of infection, DVT, component   failure, dislocation, neurovascular injury, and need for revision surgery were reviewed in the office as well discussion of   the anterior versus posterior approach were reviewed.     PROCEDURE IN DETAIL:  The patient was brought to operative theater.   Once adequate anesthesia, preoperative antibiotics, 2 gm of Ancef, 1 gm of Tranexamic Acid, and  10 mg of Decadron were administered, the patient was positioned supine on the [employer] table.  Once the patient was safely positioned with adequate padding of boney prominences we predraped out the hip, and used fluoroscopy to confirm orientation of the pelvis.      The right hip was then prepped and draped from proximal iliac crest to   mid thigh with a shower curtain technique.      Time-out was performed identifying the patient, planned procedure, and the appropriate extremity.     An incision was then made 2 cm lateral to the   anterior superior iliac spine extending over the orientation of the   tensor fascia lata muscle and sharp dissection was carried down to the   fascia of the muscle.      The fascia was then incised.  The muscle belly was identified and swept   laterally and retractor placed along the superior neck.  Following   cauterization of the circumflex vessels and removing some  pericapsular   fat, a second cobra retractor was placed on the inferior neck.  A T-capsulotomy was made along the line of the   superior neck to the trochanteric fossa, then extended proximally and   distally.  Tag sutures were placed and the retractors were then placed   intracapsular.  We then identified the trochanteric fossa and   orientation of my neck cut and then made a neck osteotomy with the femur on traction.  The femoral   head was removed without difficulty or complication.  Traction was let   off and retractors were placed posterior and anterior around the   acetabulum.      The labrum and foveal tissue were debrided.  I began reaming with a 44 mm   reamer and reamed up to 49 mm reamer with good bony bed preparation and a 50 mm  cup was chosen.  The final 50 mm Pinnacle cup was then impacted under fluoroscopy to confirm the depth of penetration and orientation with respect to   Abduction and forward flexion.  A screw was placed into the ilium followed by the hole eliminator.  The  final   32+4 neutral Altrex liner was impacted with good visualized rim fit.  The cup was positioned anatomically within the acetabular portion of the pelvis.      At this point, the femur was rolled to 100 degrees.  Further capsule was   released off the inferior aspect of the femoral neck.  I then   released the superior capsule proximally.  With the leg in a neutral position the hook was placed laterally   along the femur under the vastus lateralis origin and elevated manually and then held in position using the hook attachment on the bed.  The leg was then extended and adducted with the leg rolled to 100   degrees of external rotation.  Retractors were placed along the medial calcar and posteriorly over the greater trochanter.  Once the proximal femur was fully   exposed, I used a box osteotome to set orientation.  I then began   broaching with the starting chili pepper broach and passed this by hand and then broached up to 4.  With the 4 broach in place I chose a standard neck and did several trial reductions.  The offset was appropriate, leg lengths   appeared to be equal best matched with the +1 head ball trial confirmed radiographically.   Given these findings, I went ahead and dislocated the hip, repositioned all   retractors and positioned the right hip in the extended and abducted position.  The final 4 standard Actis stem was   chosen and it was impacted down to the level of neck cut.  Based on this   and the trial reductions, a final 32+1 delta ceramic ball was chosen and   impacted onto a clean and dry trunnion, and the hip was reduced.  The   hip had been irrigated throughout the case again at this point.  I did   reapproximate the superior capsular leaflet to the anterior leaflet   using #1 Vicryl.  The fascia of the   tensor fascia lata muscle was then reapproximated using #1 Vicryl and #0 Stratafix sutures.  The   remaining wound was closed with 2-0 Vicryl and running 4-0  Monocryl.   The hip was cleaned, dried, and dressed sterilely using Dermabond and   Aquacel dressing.  The patient was then brought   to recovery  room in stable condition tolerating the procedure well.    Danae Orleans, PA-C was present for the entirety of the case involved from   preoperative positioning, perioperative retractor management, general   facilitation of the case, as well as primary wound closure as assistant.            Pietro Cassis Alvan Dame, M.D.        06/03/2020 11:12 AM

## 2020-06-03 NOTE — Anesthesia Procedure Notes (Signed)
Spinal  Patient location during procedure: OR End time: 06/03/2020 11:10 AM Staffing Performed: resident/CRNA  Resident/CRNA: Williford, Peggy D, CRNA Preanesthetic Checklist Completed: patient identified, IV checked, site marked, risks and benefits discussed, surgical consent, monitors and equipment checked, pre-op evaluation and timeout performed Spinal Block Patient position: sitting Prep: DuraPrep Patient monitoring: heart rate, continuous pulse ox and blood pressure Approach: midline Location: L2-3 Injection technique: single-shot Needle Needle type: Sprotte  Needle gauge: 24 G Needle length: 9 cm Assessment Sensory level: T6 Additional Notes Expiration date of kit checked and confirmed. Patient tolerated procedure well, without complications.       

## 2020-06-03 NOTE — Anesthesia Postprocedure Evaluation (Signed)
Anesthesia Post Note  Patient: MAKEDA PEEKS  Procedure(s) Performed: TOTAL HIP ARTHROPLASTY ANTERIOR APPROACH (Right Hip)     Patient location during evaluation: PACU Anesthesia Type: Spinal Level of consciousness: oriented and awake and alert Pain management: pain level controlled Vital Signs Assessment: post-procedure vital signs reviewed and stable Respiratory status: spontaneous breathing and respiratory function stable Cardiovascular status: blood pressure returned to baseline and stable Postop Assessment: no headache, no backache, no apparent nausea or vomiting and patient able to bend at knees Anesthetic complications: no   No complications documented.  Last Vitals:  Vitals:   06/03/20 1515 06/03/20 1530  BP: (!) 149/91 (!) 149/90  Pulse:  73  Resp: 18 15  Temp: 36.8 C 37.1 C  SpO2: 100% 99%    Last Pain:  Vitals:   06/03/20 1530  TempSrc:   PainSc: 3                  Zimri Brennen R Nasiya Pascual

## 2020-06-03 NOTE — Transfer of Care (Signed)
Immediate Anesthesia Transfer of Care Note  Patient: Kelsey Dominguez  Procedure(s) Performed: TOTAL HIP ARTHROPLASTY ANTERIOR APPROACH (Right Hip)  Patient Location: PACU  Anesthesia Type:Spinal  Level of Consciousness: awake, alert  and oriented  Airway & Oxygen Therapy: Patient Spontanous Breathing and Patient connected to face mask oxygen  Post-op Assessment: Report given to RN and Post -op Vital signs reviewed and stable  Post vital signs: Reviewed and stable  Last Vitals:  Vitals Value Taken Time  BP    Temp    Pulse    Resp 12 06/03/20 1255  SpO2    Vitals shown include unvalidated device data.  Last Pain:  Vitals:   06/03/20 0900  TempSrc: Oral  PainSc: 6       Patients Stated Pain Goal: 3 (10/62/69 4854)  Complications: No complications documented.

## 2020-06-03 NOTE — Anesthesia Preprocedure Evaluation (Addendum)
Anesthesia Evaluation  Patient identified by MRN, date of birth, ID band Patient awake    Reviewed: Allergy & Precautions, H&P , NPO status , Patient's Chart, lab work & pertinent test results  Airway Mallampati: II  TM Distance: >3 FB Neck ROM: Full    Dental no notable dental hx.    Pulmonary neg pulmonary ROS, former smoker,    Pulmonary exam normal breath sounds clear to auscultation       Cardiovascular METS: 3 - Mets hypertension, + Peripheral Vascular Disease  Normal cardiovascular exam+ Valvular Problems/Murmurs MVP  Rhythm:Regular Rate:Normal     Neuro/Psych PSYCHIATRIC DISORDERS Anxiety negative neurological ROS     GI/Hepatic negative GI ROS, Neg liver ROS,   Endo/Other  negative endocrine ROS  Renal/GU negative Renal ROS  negative genitourinary   Musculoskeletal  (+) Arthritis , Osteoarthritis,    Abdominal Normal abdominal exam  (+)   Peds negative pediatric ROS (+)  Hematology negative hematology ROS (+)   Anesthesia Other Findings   Reproductive/Obstetrics negative OB ROS                           Anesthesia Physical Anesthesia Plan  ASA: III  Anesthesia Plan: Spinal   Post-op Pain Management:    Induction: Intravenous  PONV Risk Score and Plan: 2 and Midazolam, TIVA, Treatment may vary due to age or medical condition, Propofol infusion and Ondansetron  Airway Management Planned: Natural Airway and Simple Face Mask  Additional Equipment:   Intra-op Plan:   Post-operative Plan:   Informed Consent: I have reviewed the patients History and Physical, chart, labs and discussed the procedure including the risks, benefits and alternatives for the proposed anesthesia with the patient or authorized representative who has indicated his/her understanding and acceptance.       Plan Discussed with: CRNA, Anesthesiologist and Surgeon  Anesthesia Plan Comments: (Patient  had a covid exposure since her covid test and we are therefore waiting on the results of her day of surgery repeat Covid test.)       Anesthesia Quick Evaluation

## 2020-06-03 NOTE — Evaluation (Signed)
Physical Therapy Evaluation Patient Details Name: Kelsey Dominguez MRN: [id number] DOB: 10-18-1945 Today's Date: 06/03/2020   History of Present Illness  s/p R DA THA  Clinical Impression  Patient evaluated by Physical Therapy with no further acute PT needs identified. All education has been completed and the patient has no further questions.  See below for mobility. Initiated THA HEP an d reviewed handout for progression.  Pt is ready to d/c with family assist from PT standpoint.  See below for any follow-up Physical Therapy or equipment needs. PT is signing off. Thank you for this referral.     Follow Up Recommendations Follow surgeon's recommendation for DC plan and follow-up therapies. Supervision for mobility    Equipment Recommendations  Rolling walker with 5" wheels;3in1 (PT) (youth ht)    Recommendations for Other Services       Precautions / Restrictions Precautions Precautions: Fall Restrictions Weight Bearing Restrictions: No Other Position/Activity Restrictions: WBAT      Mobility  Bed Mobility Overal bed mobility: Needs Assistance Bed Mobility: Supine to Sit;Sit to Supine     Supine to sit: Min guard Sit to supine: Min assist   General bed mobility comments: assist with RLE, instructed in useof gait belt as leg lifter    Transfers Overall transfer level: Needs assistance Equipment used: Rolling walker (2 wheeled) Transfers: Sit to/from Stand Sit to Stand: Min guard         General transfer comment: for safety, cues for hand placement and RLE position  Ambulation/Gait Ambulation/Gait assistance: Min guard Gait Distance (Feet): 100 Feet Assistive device: Rolling walker (2 wheeled) Gait Pattern/deviations: Step-to pattern;Decreased weight shift to right;Decreased stance time - right     General Gait Details: cues for RW position, sequence  Stairs Stairs: Yes Stairs assistance: Min assist;Min guard Stair Management: No rails;Step to  pattern;Forwards;With walker Number of Stairs: 3 General stair comments: cues for sequence and safe technique  Wheelchair Mobility    Modified Rankin (Stroke Patients Only)       Balance Overall balance assessment: Mild deficits observed, not formally tested                                           Pertinent Vitals/Pain Pain Assessment: 0-10 Pain Score: 7  Pain Location: R hip Pain Descriptors / Indicators: Grimacing Pain Intervention(s): Limited activity within patient's tolerance;Premedicated before session;Monitored during session;Repositioned    Home Living Family/patient expects to be discharged to:: Private residence Living Arrangements: Spouse/significant other;Children (dtr) Available Help at Discharge: Family;Available 24 hours/day Type of Home: House Home Access: Stairs to enter Entrance Stairs-Rails: None Entrance Stairs-Number of Steps: 2 Home Layout: Two level;Able to live on main level with bedroom/bathroom Home Equipment: None      Prior Function Level of Independence: Independent               Hand Dominance        Extremity/Trunk Assessment   Upper Extremity Assessment Upper Extremity Assessment: Overall WFL for tasks assessed    Lower Extremity Assessment Lower Extremity Assessment: RLE deficits/detail RLE Deficits / Details: grossly 2+ to 3/5, limited by post op pain. AAROM grossly WFL       Communication   Communication: No difficulties  Cognition Arousal/Alertness: Awake/alert Behavior During Therapy: WFL for tasks assessed/performed Overall Cognitive Status: Within Functional Limits for tasks assessed  General Comments      Exercises Total Joint Exercises Ankle Circles/Pumps: AROM;Both;10 reps Quad Sets: AROM;Both;5 reps Heel Slides: AAROM;Right;5 reps Hip ABduction/ADduction: AAROM;Right;10 reps   Assessment/Plan    PT Assessment All further  PT needs can be met in the next venue of care  PT Problem List         PT Treatment Interventions      PT Goals (Current goals can be found in the Care Plan section)  Acute Rehab PT Goals Patient Stated Goal: recover, have less pain PT Goal Formulation: All assessment and education complete, DC therapy    Frequency     Barriers to discharge        Co-evaluation               AM-PAC PT "6 Clicks" Mobility  Outcome Measure Help needed turning from your back to your side while in a flat bed without using bedrails?: A Little Help needed moving from lying on your back to sitting on the side of a flat bed without using bedrails?: A Little Help needed moving to and from a bed to a chair (including a wheelchair)?: A Little Help needed standing up from a chair using your arms (e.g., wheelchair or bedside chair)?: A Little Help needed to walk in hospital room?: A Little Help needed climbing 3-5 steps with a railing? : A Little 6 Click Score: 18    End of Session Equipment Utilized During Treatment: Gait belt Activity Tolerance: Patient tolerated treatment well Patient left: with call bell/phone within reach;in bed;with nursing/sitter in room Nurse Communication: Mobility status PT Visit Diagnosis: Difficulty in walking, not elsewhere classified (R26.2);Other abnormalities of gait and mobility (R26.89)    Time: 9090-3014 PT Time Calculation (min) (ACUTE ONLY): 39 min   Charges:   PT Evaluation $PT Eval Low Complexity: 1 Low PT Treatments $Gait Training: 8-22 mins $Therapeutic Exercise: 8-22 mins        Baxter Flattery, PT  Acute Rehab Dept ([place]) [phone] Pager [phone]  06/03/2020   [facility] 06/03/2020, 4:41 PM

## 2020-06-03 NOTE — Discharge Instructions (Signed)

## 2020-06-04 ENCOUNTER — Encounter (HOSPITAL_COMMUNITY): Payer: Self-pay | Admitting: Orthopedic Surgery

## 2020-07-03 ENCOUNTER — Encounter: Payer: Self-pay | Admitting: Internal Medicine

## 2020-07-04 ENCOUNTER — Encounter: Payer: Self-pay | Admitting: Internal Medicine

## 2020-07-04 ENCOUNTER — Ambulatory Visit (INDEPENDENT_AMBULATORY_CARE_PROVIDER_SITE_OTHER): Payer: Medicare Other | Admitting: Internal Medicine

## 2020-07-04 ENCOUNTER — Other Ambulatory Visit: Payer: Self-pay

## 2020-07-04 DIAGNOSIS — H2000 Unspecified acute and subacute iridocyclitis: Secondary | ICD-10-CM | POA: Insufficient documentation

## 2020-07-04 LAB — POCT URINALYSIS DIPSTICK
Bilirubin, UA: NEGATIVE
Glucose, UA: NEGATIVE
Ketones, UA: NEGATIVE
Protein, UA: POSITIVE — AB
Spec Grav, UA: 1.025 (ref 1.010–1.025)
Urobilinogen, UA: 0.2 E.U./dL
pH, UA: 6 (ref 5.0–8.0)

## 2020-07-04 MED ORDER — CEPHALEXIN 500 MG PO CAPS: 500.0000 mg | ORAL_CAPSULE | Freq: Two times a day (BID) | ORAL | 0 refills | Status: DC

## 2020-07-04 NOTE — Addendum Note (Signed)
Addended by: Hinda Kehr on: 07/04/2020 01:59 PM   Modules accepted: Orders

## 2020-07-04 NOTE — Progress Notes (Signed)
Subjective:    Patient ID: Kelsey Dominguez, female    DOB: [birth year], 75 y.o.   MRN: [id number]  HPI The patient is here for an acute visit.   She noticed symptoms yesterday.  She states increased frequency and pressure.  She has had a little dysuria.  No hematuria, fever, abdominal pain or nausea.   Has not had a UTI in years.     Medications and allergies reviewed with patient and updated if appropriate.  Patient Active Problem List   Diagnosis Date Noted  . S/P right total hip arthroplasty 06/03/2020  . Wrist pain, acute, right 04/14/2020  . Right ankle pain 04/14/2020  . Glaucoma 08/08/2019  . Vitamin D deficiency 08/08/2019  . Stenosis of left subclavian artery ([place]) 06/30/2018  . Left carotid bruit 06/20/2018  . Hyperlipidemia 06/20/2018  . AC joint arthropathy 03/13/2016  . Osteopenia 03/13/2016  . Performance anxiety 03/13/2016  . White coat syndrome without diagnosis of hypertension 03/13/2016  . Osteoarthritis, hip, bilateral 11/25/2015    Current Outpatient Medications on File Prior to Visit  Medication Sig Dispense Refill  . calcium-vitamin D (OSCAL WITH D) 500-200 MG-UNIT tablet Take 1 tablet by mouth daily.    . Cholecalciferol (VITAMIN D3) 2000 units TABS Take 4,000 Units by mouth daily.     Marland Kitchen docusate sodium (COLACE) 100 MG capsule Take 1 capsule (100 mg total) by mouth 2 (two) times daily.    . ferrous sulfate 325 (65 FE) MG tablet Take 1 tablet (325 mg total) by mouth 3 (three) times daily with meals. Take for two weeks as tolerated.    . Glucosamine-Chondroit-Vit C-Mn (GLUCOSAMINE 1500 COMPLEX PO) Take 1,500 mg by mouth daily.    Marland Kitchen HYDROcodone-acetaminophen (NORCO/VICODIN) 5-325 MG tablet Take 1-2 tablets by mouth every 4 (four) hours as needed for moderate pain. 42 tablet 0  . methocarbamol (ROBAXIN) 500 MG tablet Take 1 tablet (500 mg total) by mouth every 6 (six) hours as needed for muscle spasms. 40 tablet 0  . polyethylene glycol (MIRALAX /  GLYCOLAX) 17 g packet Take 17 g by mouth 2 (two) times daily. 14 each 0  . TURMERIC PO Take 1,000 mg by mouth daily.    . vitamin B-12 (CYANOCOBALAMIN) 1000 MCG tablet Take 1,000 mcg by mouth daily.     No current facility-administered medications on file prior to visit.    Past Medical History:  Diagnosis Date  . Arthritis   . Mitral valve prolapse   . Stenosis of artery of right upper extremity [facility])     Past Surgical History:  Procedure Laterality Date  . COLONOSCOPY    . LIPOSUCTION HEAD / NECK     chin and neck  . ROTATOR CUFF REPAIR Right   . TOTAL HIP ARTHROPLASTY Right 06/03/2020   Procedure: TOTAL HIP ARTHROPLASTY ANTERIOR APPROACH;  Surgeon: Paralee Cancel, MD;  Location: [facility] ORS;  Service: Orthopedics;  Laterality: Right;  70 mins    Social History   Socioeconomic History  . Marital status: Married    Spouse name: Not on file  . Number of children: Not on file  . Years of education: Not on file  . Highest education level: Not on file  Occupational History  . Not on file  Tobacco Use  . Smoking status: Former Research scientist (life sciences)  . Smokeless tobacco: Never Used  . Tobacco comment: smoked 50yrs age 3 to 86  Vaping Use  . Vaping Use: Never used  Substance and Sexual Activity  .  Alcohol use: Yes    Alcohol/week: 1.0 - 3.0 standard drink    Types: 1 - 3 Shots of liquor per week    Comment: daily  . Drug use: No  . Sexual activity: Not on file  Other Topics Concern  . Not on file  Social History Narrative   Walks 2-3 times a week   Social Determinants of Health   Financial Resource Strain: Not on file  Food Insecurity: Not on file  Transportation Needs: Not on file  Physical Activity: Not on file  Stress: Not on file  Social Connections: Not on file    Family History  Problem Relation Age of Onset  . Lung cancer Mother   . Alzheimer's disease Father   . Heart disease Father   . Heart disease Brother        s/p stent  . Stroke Maternal Grandfather      Review of Systems  Constitutional: Negative for fever.  Gastrointestinal: Negative for abdominal pain and nausea.  Genitourinary: Positive for dysuria and frequency. Negative for difficulty urinating and hematuria.       Bladder pressure       Objective:   Vitals:   07/04/20 1334  BP: 136/84  Pulse: 83  Temp: 97.6 F (36.4 C)  SpO2: 97%   BP Readings from Last 3 Encounters:  07/04/20 136/84  06/03/20 (!) 152/83  05/23/20 (!) 162/81   Wt Readings from Last 3 Encounters:  07/04/20 140 lb (63.5 kg)  05/23/20 140 lb (63.5 kg)  04/14/20 141 lb 3.2 oz (64 kg)   Body mass index is 25.61 kg/m.   Physical Exam Constitutional:      Appearance: Normal appearance.  HENT:     Head: Normocephalic and atraumatic.  Abdominal:     General: There is no distension.     Palpations: Abdomen is soft.     Tenderness: There is no abdominal tenderness. There is no right CVA tenderness or left CVA tenderness.  Skin:    General: Skin is warm and dry.  Neurological:     Mental Status: She is alert.            Assessment & Plan:    See Problem List for Assessment and Plan of chronic medical problems.    This visit occurred during the SARS-CoV-2 public health emergency.  Safety protocols were in place, including screening questions prior to the visit, additional usage of staff PPE, and extensive cleaning of exam room while observing appropriate contact time as indicated for disinfecting solutions.

## 2020-07-04 NOTE — Patient Instructions (Signed)
Take the antibiotic as prescribed.  Take tylenol if needed.     Increase your water intake.   Call if no improvement     Urinary Tract Infection, Adult A urinary tract infection (UTI) is an infection of any part of the urinary tract, which includes the kidneys, ureters, bladder, and urethra. These organs make, store, and get rid of urine in the body. UTI can be a bladder infection (cystitis) or kidney infection (pyelonephritis). What are the causes? This infection may be caused by fungi, viruses, or bacteria. Bacteria are the most common cause of UTIs. This condition can also be caused by repeated incomplete emptying of the bladder during urination. What increases the risk? This condition is more likely to develop if:  You ignore your need to urinate or hold urine for long periods of time.  You do not empty your bladder completely during urination.  You wipe back to front after urinating or having a bowel movement, if you are female.  You are uncircumcised, if you are female.  You are constipated.  You have a urinary catheter that stays in place (indwelling).  You have a weak defense (immune) system.  You have a medical condition that affects your bowels, kidneys, or bladder.  You have diabetes.  You take antibiotic medicines frequently or for long periods of time, and the antibiotics no longer work well against certain types of infections (antibiotic resistance).  You take medicines that irritate your urinary tract.  You are exposed to chemicals that irritate your urinary tract.  You are female.  What are the signs or symptoms? Symptoms of this condition include:  Fever.  Frequent urination or passing small amounts of urine frequently.  Needing to urinate urgently.  Pain or burning with urination.  Urine that smells bad or unusual.  Cloudy urine.  Pain in the lower abdomen or back.  Trouble urinating.  Blood in the urine.  Vomiting or being less hungry than  normal.  Diarrhea or abdominal pain.  Vaginal discharge, if you are female.  How is this diagnosed? This condition is diagnosed with a medical history and physical exam. You will also need to provide a urine sample to test your urine. Other tests may be done, including:  Blood tests.  Sexually transmitted disease (STD) testing.  If you have had more than one UTI, a cystoscopy or imaging studies may be done to determine the cause of the infections. How is this treated? Treatment for this condition often includes a combination of two or more of the following:  Antibiotic medicine.  Other medicines to treat less common causes of UTI.  Over-the-counter medicines to treat pain.  Drinking enough water to stay hydrated.  Follow these instructions at home:  Take over-the-counter and prescription medicines only as told by your health care provider.  If you were prescribed an antibiotic, take it as told by your health care provider. Do not stop taking the antibiotic even if you start to feel better.  Avoid alcohol, caffeine, tea, and carbonated beverages. They can irritate your bladder.  Drink enough fluid to keep your urine clear or pale yellow.  Keep all follow-up visits as told by your health care provider. This is important.  Make sure to: ? Empty your bladder often and completely. Do not hold urine for long periods of time. ? Empty your bladder before and after sex. ? Wipe from front to back after a bowel movement if you are female. Use each tissue one time when you   wipe. Contact a health care provider if:  You have back pain.  You have a fever.  You feel nauseous or vomit.  Your symptoms do not get better after 3 days.  Your symptoms go away and then return. Get help right away if:  You have severe back pain or lower abdominal pain.  You are vomiting and cannot keep down any medicines or water. This information is not intended to replace advice given to you by  your health care provider. Make sure you discuss any questions you have with your health care provider. Document Released: 02/03/2005 Document Revised: 10/08/2015 Document Reviewed: 03/17/2015 Elsevier Interactive Patient Education  2018 Elsevier Inc.   

## 2020-07-04 NOTE — Assessment & Plan Note (Signed)
Acute Urine dip consistent with UTI Will send urine for culture Take the antibiotic as prescribed.  Keflex 500 mg BID x 7 days Take tylenol if needed.   Increase your water intake.  Call if no improvement

## 2020-07-16 DIAGNOSIS — Z96641 Presence of right artificial hip joint: Secondary | ICD-10-CM | POA: Diagnosis not present

## 2020-07-16 DIAGNOSIS — Z471 Aftercare following joint replacement surgery: Secondary | ICD-10-CM | POA: Diagnosis not present

## 2020-08-07 DIAGNOSIS — Z23 Encounter for immunization: Secondary | ICD-10-CM | POA: Diagnosis not present

## 2020-08-27 DIAGNOSIS — Z96641 Presence of right artificial hip joint: Secondary | ICD-10-CM | POA: Diagnosis not present

## 2020-08-27 DIAGNOSIS — M7061 Trochanteric bursitis, right hip: Secondary | ICD-10-CM | POA: Diagnosis not present

## 2020-08-27 DIAGNOSIS — Z471 Aftercare following joint replacement surgery: Secondary | ICD-10-CM | POA: Diagnosis not present

## 2020-09-03 DIAGNOSIS — M25511 Pain in right shoulder: Secondary | ICD-10-CM | POA: Diagnosis not present

## 2020-09-09 ENCOUNTER — Encounter: Payer: Self-pay | Admitting: Internal Medicine

## 2020-09-09 DIAGNOSIS — I771 Stricture of artery: Secondary | ICD-10-CM

## 2020-09-15 ENCOUNTER — Encounter (HOSPITAL_COMMUNITY): Payer: Medicare Other

## 2020-09-17 ENCOUNTER — Ambulatory Visit (HOSPITAL_COMMUNITY)
Admission: RE | Admit: 2020-09-17 | Discharge: 2020-09-17 | Disposition: A | Discharge status: Home / Self Care | Payer: Medicare Other | Source: Ambulatory Visit | Attending: Cardiovascular Disease | Admitting: Cardiovascular Disease

## 2020-09-17 ENCOUNTER — Other Ambulatory Visit: Payer: Self-pay

## 2020-09-17 DIAGNOSIS — I6522 Occlusion and stenosis of left carotid artery: Secondary | ICD-10-CM | POA: Diagnosis not present

## 2020-09-17 DIAGNOSIS — I771 Stricture of artery: Secondary | ICD-10-CM | POA: Insufficient documentation

## 2020-09-22 DIAGNOSIS — H40023 Open angle with borderline findings, high risk, bilateral: Secondary | ICD-10-CM | POA: Diagnosis not present

## 2020-09-23 DIAGNOSIS — M1611 Unilateral primary osteoarthritis, right hip: Secondary | ICD-10-CM | POA: Diagnosis not present

## 2020-10-20 DIAGNOSIS — L430 Hypertrophic lichen planus: Secondary | ICD-10-CM | POA: Diagnosis not present

## 2020-10-20 DIAGNOSIS — L821 Other seborrheic keratosis: Secondary | ICD-10-CM | POA: Diagnosis not present

## 2020-10-20 DIAGNOSIS — D225 Melanocytic nevi of trunk: Secondary | ICD-10-CM | POA: Diagnosis not present

## 2020-10-20 DIAGNOSIS — D1801 Hemangioma of skin and subcutaneous tissue: Secondary | ICD-10-CM | POA: Diagnosis not present

## 2020-10-20 DIAGNOSIS — L720 Epidermal cyst: Secondary | ICD-10-CM | POA: Diagnosis not present

## 2020-10-20 DIAGNOSIS — D224 Melanocytic nevi of scalp and neck: Secondary | ICD-10-CM | POA: Diagnosis not present

## 2020-10-20 DIAGNOSIS — L820 Inflamed seborrheic keratosis: Secondary | ICD-10-CM | POA: Diagnosis not present

## 2020-10-20 DIAGNOSIS — L570 Actinic keratosis: Secondary | ICD-10-CM | POA: Diagnosis not present

## 2020-10-20 DIAGNOSIS — L814 Other melanin hyperpigmentation: Secondary | ICD-10-CM | POA: Diagnosis not present

## 2020-10-20 DIAGNOSIS — D485 Neoplasm of uncertain behavior of skin: Secondary | ICD-10-CM | POA: Diagnosis not present

## 2020-10-23 ENCOUNTER — Ambulatory Visit: Payer: Medicare Other | Attending: Critical Care Medicine

## 2020-10-23 DIAGNOSIS — Z20822 Contact with and (suspected) exposure to covid-19: Secondary | ICD-10-CM | POA: Diagnosis not present

## 2020-10-24 LAB — NOVEL CORONAVIRUS, NAA: SARS-CoV-2, NAA: NOT DETECTED

## 2020-10-24 LAB — SARS-COV-2, NAA 2 DAY TAT

## 2020-10-25 ENCOUNTER — Emergency Department (HOSPITAL_BASED_OUTPATIENT_CLINIC_OR_DEPARTMENT_OTHER)
Admission: EM | Admit: 2020-10-25 | Discharge: 2020-10-25 | Disposition: A | Discharge status: Home / Self Care | Payer: Medicare Other | Attending: Emergency Medicine | Admitting: Emergency Medicine

## 2020-10-25 ENCOUNTER — Encounter (HOSPITAL_BASED_OUTPATIENT_CLINIC_OR_DEPARTMENT_OTHER): Payer: Self-pay

## 2020-10-25 ENCOUNTER — Other Ambulatory Visit: Payer: Self-pay

## 2020-10-25 ENCOUNTER — Encounter: Payer: Self-pay | Admitting: Internal Medicine

## 2020-10-25 DIAGNOSIS — Z96641 Presence of right artificial hip joint: Secondary | ICD-10-CM | POA: Diagnosis not present

## 2020-10-25 DIAGNOSIS — J069 Acute upper respiratory infection, unspecified: Secondary | ICD-10-CM | POA: Insufficient documentation

## 2020-10-25 DIAGNOSIS — Z87891 Personal history of nicotine dependence: Secondary | ICD-10-CM | POA: Diagnosis not present

## 2020-10-25 DIAGNOSIS — B9789 Other viral agents as the cause of diseases classified elsewhere: Secondary | ICD-10-CM | POA: Diagnosis not present

## 2020-10-25 DIAGNOSIS — R059 Cough, unspecified: Secondary | ICD-10-CM | POA: Diagnosis present

## 2020-10-25 NOTE — Discharge Instructions (Addendum)
If you develop high fever, severe cough or cough with blood, trouble breathing, severe headache, neck pain/stiffness, vomiting, or any other new/concerning symptoms then return to the ER for evaluation  

## 2020-10-25 NOTE — ED Triage Notes (Signed)
Patient states she started to have sore throat and cough on Tuesday that has progressively got worse. Negative covid test on Thurs. States she is coughing up pale yellow mucous. VSS A&O x 4

## 2020-10-25 NOTE — ED Notes (Signed)
States onset Tuesday of cough and sore throat.

## 2020-10-25 NOTE — ED Provider Notes (Signed)
[facility] EMERGENCY DEPT Provider Note   CSN: [id number] Arrival date & time: 10/25/20  1428     History Chief Complaint  Patient presents with   Cough   Sore Throat    Kelsey Dominguez is a 75 y.o. female.  HPI 75 year old female presents for evaluation of sore throat.  Originally started on 6/14.  She had a COVID test that sounds ago PCR test 2 days ago that resulted this morning is negative.  She has had cough, congestion/rhinorrhea, sore throat.  She denies a fever, trouble breathing, neck pain or swelling.  She wonders if she should get a strep test.  Past Medical History:  Diagnosis Date   Arthritis    Mitral valve prolapse    Stenosis of artery of right upper extremity [facility])     Patient Active Problem List   Diagnosis Date Noted   Acute cyclitis 07/04/2020   S/P right total hip arthroplasty 06/03/2020   Wrist pain, acute, right 04/14/2020   Right ankle pain 04/14/2020   Glaucoma 08/08/2019   Vitamin D deficiency 08/08/2019   Stenosis of left subclavian artery ([place]) 06/30/2018   Left carotid bruit 06/20/2018   Hyperlipidemia 06/20/2018   AC joint arthropathy 03/13/2016   Osteopenia 03/13/2016   Performance anxiety 03/13/2016   White coat syndrome without diagnosis of hypertension 03/13/2016   Osteoarthritis, hip, bilateral 11/25/2015    Past Surgical History:  Procedure Laterality Date   COLONOSCOPY     LIPOSUCTION HEAD / NECK     chin and neck   ROTATOR CUFF REPAIR Right    TOTAL HIP ARTHROPLASTY Right 06/03/2020   Procedure: TOTAL HIP ARTHROPLASTY ANTERIOR APPROACH;  Surgeon: Paralee Cancel, MD;  Location: [facility]S;  Service: Orthopedics;  Laterality: Right;  70 mins     OB History   No obstetric history on file.     Family History  Problem Relation Age of Onset   Lung cancer Mother    Alzheimer's disease Father    Heart disease Father    Heart disease Brother        s/p stent   Stroke Maternal Grandfather     Social History    Tobacco Use   Smoking status: Former    Pack years: 0.00   Smokeless tobacco: Never   Tobacco comments:    smoked 74yrs age 16 to 26  Vaping Use   Vaping Use: Never used  Substance Use Topics   Alcohol use: Yes    Alcohol/week: 1.0 - 3.0 standard drink    Types: 1 - 3 Shots of liquor per week    Comment: daily   Drug use: No    Home Medications Prior to Admission medications   Medication Sig Start Date End Date Taking? Authorizing Provider  calcium-vitamin D (OSCAL WITH D) 500-200 MG-UNIT tablet Take 1 tablet by mouth daily.    [provider]  cephALEXin (KEFLEX) 500 MG capsule Take 1 capsule (500 mg total) by mouth 2 (two) times daily. 07/04/20   Binnie Rail, MD  Cholecalciferol (VITAMIN D3) 2000 units TABS Take 4,000 Units by mouth daily.     [provider]  Glucosamine-Chondroit-Vit C-Mn (GLUCOSAMINE 1500 COMPLEX PO) Take 1,500 mg by mouth daily.    [provider]  HYDROcodone-acetaminophen (NORCO/VICODIN) 5-325 MG tablet Take 1-2 tablets by mouth every 4 (four) hours as needed for moderate pain. 06/03/20   Irving Copas, PA-C  TURMERIC PO Take 1,000 mg by mouth daily.    [provider]  vitamin B-12 (CYANOCOBALAMIN) 1000 MCG tablet Take 1,000 mcg by mouth daily.    [provider]    Allergies    Patient has no known allergies.  Review of Systems   Review of Systems  Constitutional:  Negative for fever.  HENT:  Positive for congestion, rhinorrhea and sore throat. Negative for ear pain and trouble swallowing.   Respiratory:  Positive for cough. Negative for shortness of breath.   Gastrointestinal:  Negative for vomiting.  All other systems reviewed and are negative.  Physical Exam Updated Vital Signs BP (!) 148/100 (BP Location: Right Arm)   Pulse 70   Temp 98.5 F (36.9 C) (Oral)   Resp 16   SpO2 99%   Physical Exam Vitals and nursing note reviewed.  Constitutional:      Appearance: She is well-developed.   HENT:     Head: Normocephalic and atraumatic.     Right Ear: External ear normal.     Left Ear: External ear normal.     Nose: Nose normal.     Mouth/Throat:     Pharynx: No uvula swelling.     Tonsils: No tonsillar exudate or tonsillar abscesses.  Eyes:     General:        Right eye: No discharge.        Left eye: No discharge.  Cardiovascular:     Rate and Rhythm: Normal rate and regular rhythm.     Heart sounds: Normal heart sounds.  Pulmonary:     Effort: Pulmonary effort is normal.     Breath sounds: Normal breath sounds.  Abdominal:     Palpations: Abdomen is soft.     Tenderness: There is no abdominal tenderness.  Musculoskeletal:     Cervical back: Normal range of motion and neck supple.  Lymphadenopathy:     Cervical: No cervical adenopathy.  Skin:    General: Skin is warm and dry.  Neurological:     Mental Status: She is alert.  Psychiatric:        Mood and Affect: Mood is not anxious.    ED Results / Procedures / Treatments   Labs (all labs ordered are listed, but only abnormal results are displayed) Labs Reviewed - No data to display  EKG None  Radiology No results found.  Procedures Procedures   Medications Ordered in ED Medications - No data to display  ED Course  I have reviewed the triage vital signs and the nursing notes.  Pertinent labs & imaging results that were available during my care of the patient were reviewed by me and considered in my medical decision making (see chart for details).    MDM Rules/Calculators/A&P                          Patient is well appearing. Besides some HTN, vitals are normal. Unlikely to be strep based on centor criteria (-1).  Highly doubt deep space infection.  She just had a negative COVID test and would not want another right now.  Discussed possibility of an x-ray given the cough for several days but her lungs are clear and I think pneumonia is pretty unlikely.  At this point we discussed symptomatic  care but she otherwise is fine going home and does not want any type of testing given the very low likelihood of strep.  Will discharge home with return precautions. Final Clinical Impression(s) / ED Diagnoses Final diagnoses:  Viral  upper respiratory infection    Rx / DC Orders ED Discharge Orders     None        Sherwood Gambler, MD 10/25/20 1510

## 2020-10-29 ENCOUNTER — Encounter: Payer: Self-pay | Admitting: Internal Medicine

## 2020-11-03 ENCOUNTER — Ambulatory Visit: Payer: Medicare Other | Admitting: Internal Medicine

## 2020-12-29 ENCOUNTER — Telehealth: Payer: Self-pay | Admitting: Internal Medicine

## 2020-12-29 NOTE — Telephone Encounter (Signed)
Kelsey Dominguez from [facility] called Patient is scheduled for a bone density and mammogram on 8.23.22  [facility] needs the order faxed to [phone]

## 2020-12-30 DIAGNOSIS — Z1231 Encounter for screening mammogram for malignant neoplasm of breast: Secondary | ICD-10-CM | POA: Diagnosis not present

## 2020-12-30 DIAGNOSIS — M8589 Other specified disorders of bone density and structure, multiple sites: Secondary | ICD-10-CM | POA: Diagnosis not present

## 2020-12-30 LAB — HM MAMMOGRAPHY

## 2020-12-30 NOTE — Telephone Encounter (Signed)
Faxed today

## 2021-01-02 ENCOUNTER — Encounter: Payer: Self-pay | Admitting: Internal Medicine

## 2021-01-08 ENCOUNTER — Other Ambulatory Visit: Payer: Self-pay | Admitting: Internal Medicine

## 2021-01-08 ENCOUNTER — Encounter: Payer: Self-pay | Admitting: Internal Medicine

## 2021-01-15 ENCOUNTER — Encounter: Payer: Self-pay | Admitting: Internal Medicine

## 2021-01-15 NOTE — Progress Notes (Signed)
Outside notes received. Information abstracted. Notes sent to scan.  

## 2021-01-28 DIAGNOSIS — Z23 Encounter for immunization: Secondary | ICD-10-CM | POA: Diagnosis not present

## 2021-03-05 DIAGNOSIS — Z23 Encounter for immunization: Secondary | ICD-10-CM | POA: Diagnosis not present

## 2021-03-24 DIAGNOSIS — H40023 Open angle with borderline findings, high risk, bilateral: Secondary | ICD-10-CM | POA: Diagnosis not present

## 2021-03-24 DIAGNOSIS — H43813 Vitreous degeneration, bilateral: Secondary | ICD-10-CM | POA: Diagnosis not present

## 2021-03-24 DIAGNOSIS — H5203 Hypermetropia, bilateral: Secondary | ICD-10-CM | POA: Diagnosis not present

## 2021-03-24 DIAGNOSIS — H2513 Age-related nuclear cataract, bilateral: Secondary | ICD-10-CM | POA: Diagnosis not present

## 2021-06-04 DIAGNOSIS — Z96641 Presence of right artificial hip joint: Secondary | ICD-10-CM | POA: Diagnosis not present

## 2021-06-04 DIAGNOSIS — Z471 Aftercare following joint replacement surgery: Secondary | ICD-10-CM | POA: Diagnosis not present

## 2021-06-04 DIAGNOSIS — M5416 Radiculopathy, lumbar region: Secondary | ICD-10-CM | POA: Diagnosis not present

## 2021-06-04 DIAGNOSIS — M545 Low back pain, unspecified: Secondary | ICD-10-CM | POA: Diagnosis not present

## 2021-06-07 DIAGNOSIS — M5416 Radiculopathy, lumbar region: Secondary | ICD-10-CM | POA: Insufficient documentation

## 2021-07-09 DIAGNOSIS — M5410 Radiculopathy, site unspecified: Secondary | ICD-10-CM | POA: Diagnosis not present

## 2021-07-09 DIAGNOSIS — M25562 Pain in left knee: Secondary | ICD-10-CM | POA: Diagnosis not present

## 2021-07-09 DIAGNOSIS — M2392 Unspecified internal derangement of left knee: Secondary | ICD-10-CM | POA: Diagnosis not present

## 2021-07-09 DIAGNOSIS — M545 Low back pain, unspecified: Secondary | ICD-10-CM | POA: Diagnosis not present

## 2021-07-19 DIAGNOSIS — M2392 Unspecified internal derangement of left knee: Secondary | ICD-10-CM | POA: Insufficient documentation

## 2021-09-20 IMAGING — DX DG WRIST COMPLETE 3+V*R*
4 series · 4 of 4 positions shown · non-contrast
Comparison: None.

CLINICAL DATA: Fall 2 days ago.

EXAM:
RIGHT WRIST - COMPLETE 3+ VIEW

[wrist ap]
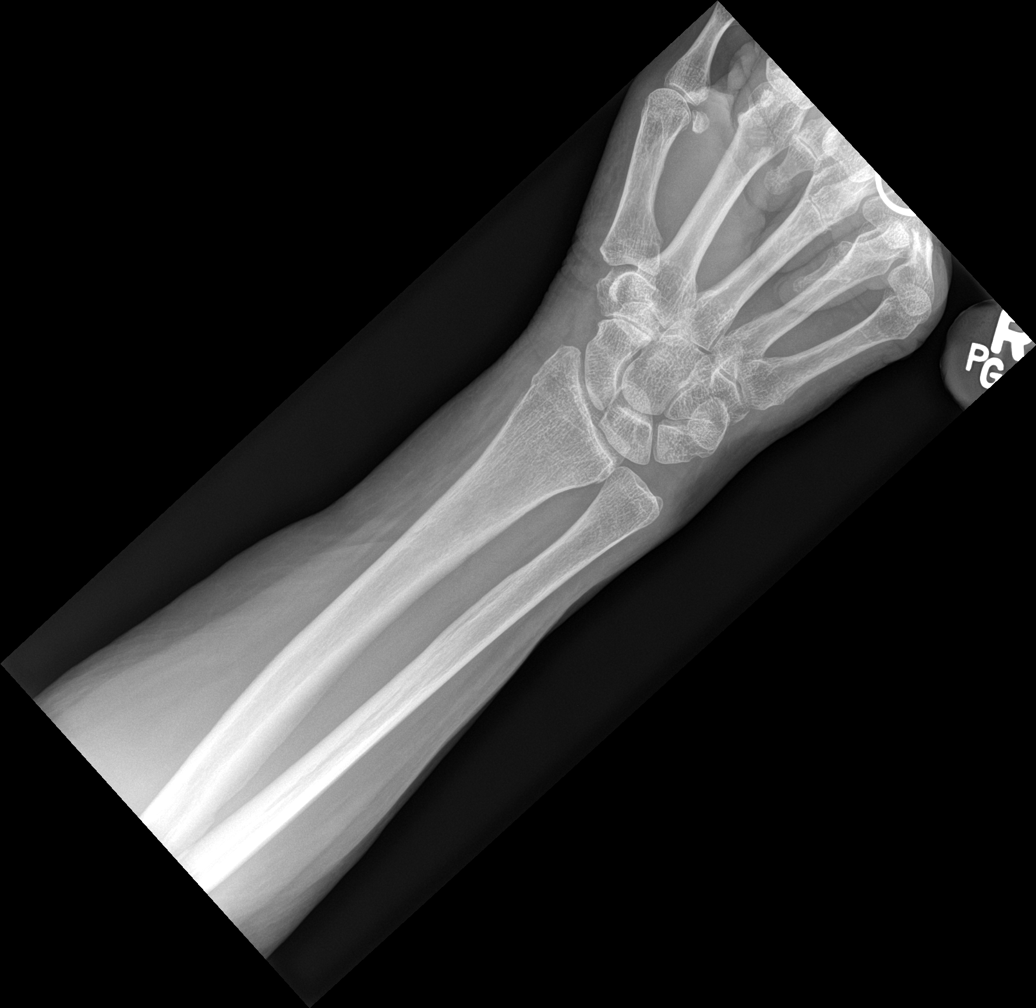

[wrist obl]
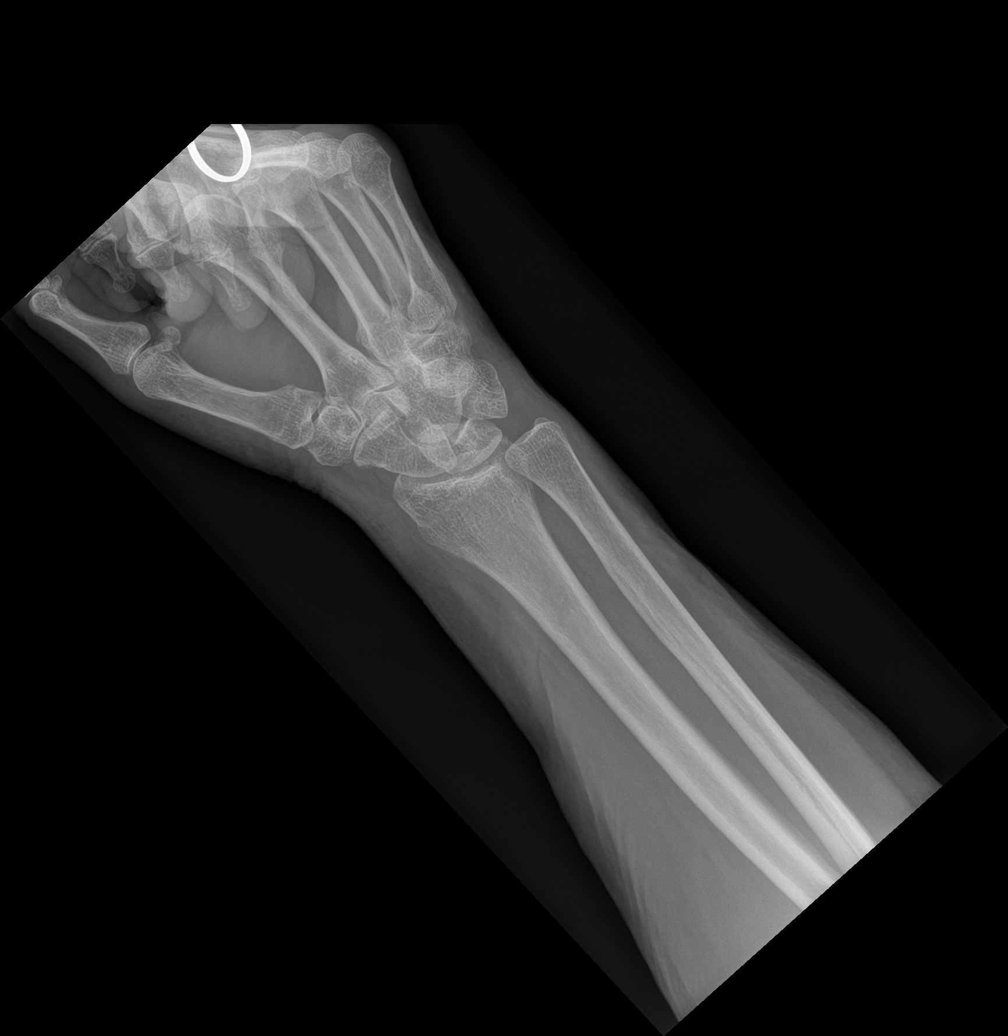

[wrist lat]
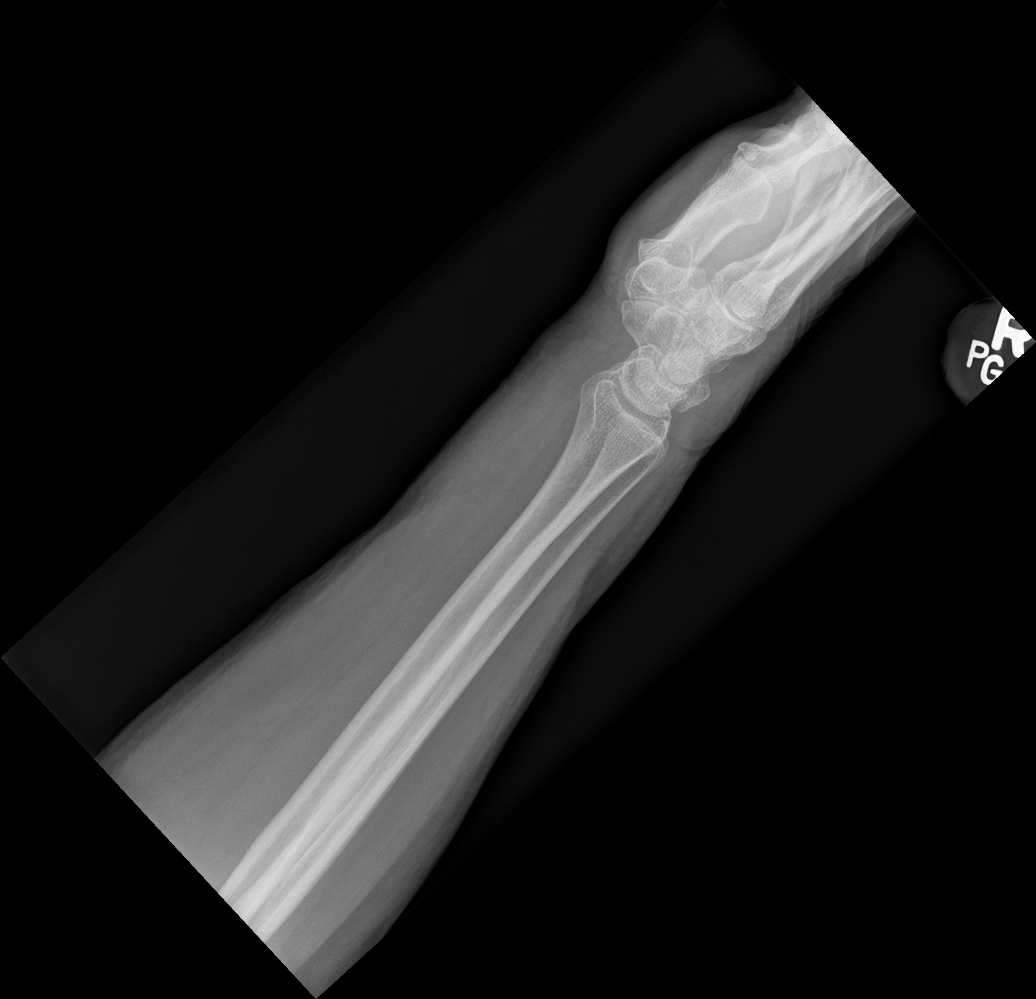

[scaphoid]
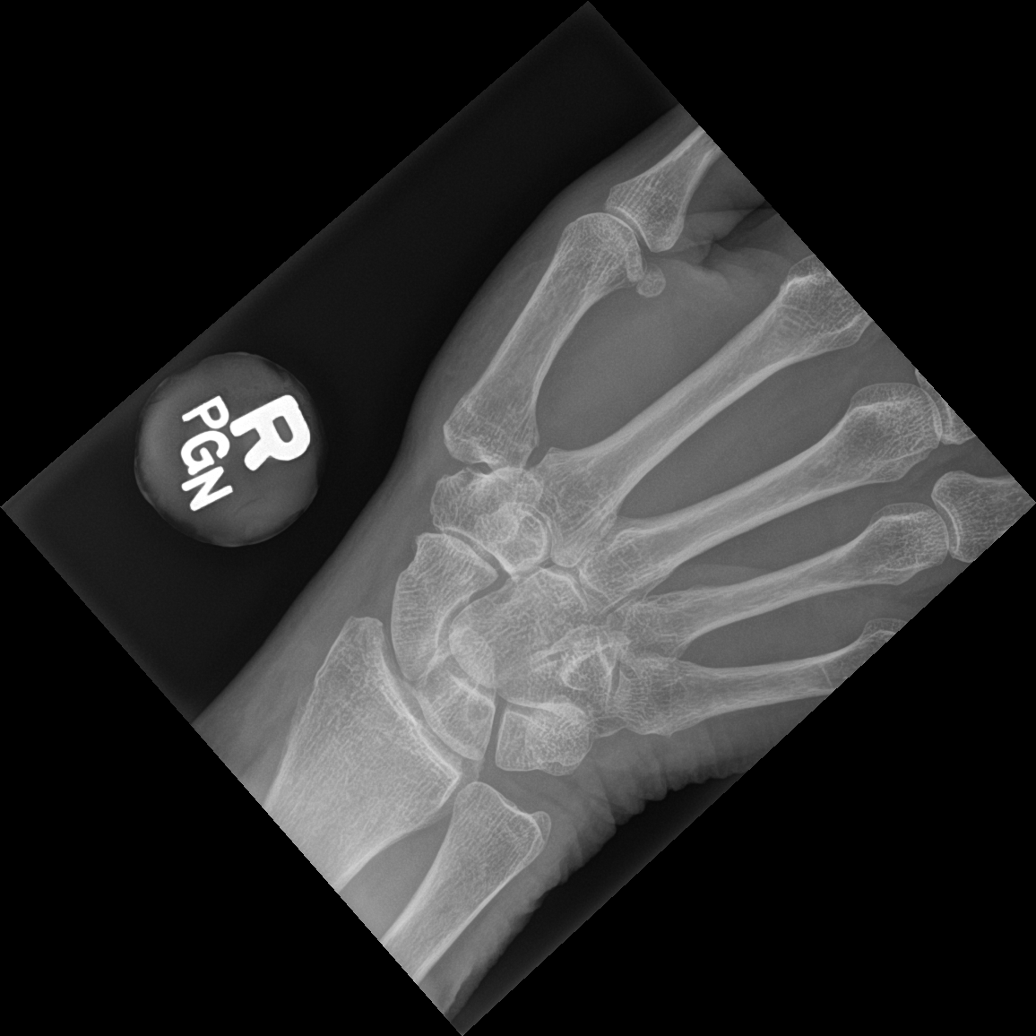

[4 of 4 positions shown; findings below may reference images not displayed]

FINDINGS: There is no evidence of fracture or dislocation. There is no
evidence of arthropathy or other focal bone abnormality. Soft
tissues are unremarkable.
IMPRESSION: Negative.

## 2021-09-22 DIAGNOSIS — D225 Melanocytic nevi of trunk: Secondary | ICD-10-CM | POA: Diagnosis not present

## 2021-09-22 DIAGNOSIS — D1801 Hemangioma of skin and subcutaneous tissue: Secondary | ICD-10-CM | POA: Diagnosis not present

## 2021-09-22 DIAGNOSIS — L570 Actinic keratosis: Secondary | ICD-10-CM | POA: Diagnosis not present

## 2021-09-22 DIAGNOSIS — L298 Other pruritus: Secondary | ICD-10-CM | POA: Diagnosis not present

## 2021-09-22 DIAGNOSIS — L821 Other seborrheic keratosis: Secondary | ICD-10-CM | POA: Diagnosis not present

## 2021-09-22 DIAGNOSIS — L438 Other lichen planus: Secondary | ICD-10-CM | POA: Diagnosis not present

## 2021-09-23 DIAGNOSIS — H40023 Open angle with borderline findings, high risk, bilateral: Secondary | ICD-10-CM | POA: Diagnosis not present

## 2021-09-23 DIAGNOSIS — H04123 Dry eye syndrome of bilateral lacrimal glands: Secondary | ICD-10-CM | POA: Diagnosis not present

## 2021-09-24 DIAGNOSIS — Z124 Encounter for screening for malignant neoplasm of cervix: Secondary | ICD-10-CM | POA: Diagnosis not present

## 2021-09-24 DIAGNOSIS — Z0142 Encounter for cervical smear to confirm findings of recent normal smear following initial abnormal smear: Secondary | ICD-10-CM | POA: Diagnosis not present

## 2021-09-24 DIAGNOSIS — Z01411 Encounter for gynecological examination (general) (routine) with abnormal findings: Secondary | ICD-10-CM | POA: Diagnosis not present

## 2021-09-24 DIAGNOSIS — M8580 Other specified disorders of bone density and structure, unspecified site: Secondary | ICD-10-CM | POA: Diagnosis not present

## 2021-09-24 DIAGNOSIS — Z6826 Body mass index (BMI) 26.0-26.9, adult: Secondary | ICD-10-CM | POA: Diagnosis not present

## 2021-09-24 DIAGNOSIS — Z01419 Encounter for gynecological examination (general) (routine) without abnormal findings: Secondary | ICD-10-CM | POA: Diagnosis not present

## 2021-10-04 ENCOUNTER — Encounter: Payer: Self-pay | Admitting: Internal Medicine

## 2021-10-04 NOTE — Progress Notes (Unsigned)
Subjective:    Patient ID: Kelsey Dominguez, female    DOB: 08-22-1945, 76 y.o.   MRN: [id number]      HPI Kelsey Dominguez is here for  Chief Complaint  Patient presents with   Follow-up    She is taking her vitamins daily.  She is exercising, but not regularly- can do more.    Dizziness at night sometimes when she wakes up -this only occurs when she first wakes up and is transient.  It is like a spinning sensation.  She worried about her memory because of her family history.  Medications and allergies reviewed with patient and updated if appropriate.  Current Outpatient Medications on File Prior to Visit  Medication Sig Dispense Refill   calcium-vitamin D (OSCAL WITH D) 500-200 MG-UNIT tablet Take 1 tablet by mouth daily.     Cholecalciferol (VITAMIN D3) 2000 units TABS Take 4,000 Units by mouth daily.      Glucosamine-Chondroit-Vit C-Mn (GLUCOSAMINE 1500 COMPLEX PO) Take 1,500 mg by mouth daily.     TURMERIC PO Take 1,000 mg by mouth daily.     vitamin B-12 (CYANOCOBALAMIN) 1000 MCG tablet Take 1,000 mcg by mouth daily.     No current facility-administered medications on file prior to visit.    Review of Systems  Constitutional:  Negative for chills and fever.  Respiratory:  Negative for cough, shortness of breath and wheezing.   Cardiovascular:  Negative for chest pain, palpitations and leg swelling.  Neurological:  Positive for dizziness (occ at night) and headaches (in middle of night - occ). Negative for light-headedness.      Objective:   Vitals:   10/06/21 0831 10/06/21 0932  BP: (!) 142/84 130/78  Pulse: 65   Temp: 98 F (36.7 C)   SpO2: 97%    Filed Weights   10/06/21 0831  Weight: 142 lb 3.2 oz (64.5 kg)   Body mass index is 26.01 kg/m.  BP Readings from Last 3 Encounters:  10/06/21 130/78  10/25/20 (!) 148/100  07/04/20 136/84    Wt Readings from Last 3 Encounters:  10/06/21 142 lb 3.2 oz (64.5 kg)  07/04/20 140 lb (63.5 kg)  05/23/20 140 lb  (63.5 kg)       Physical Exam Constitutional: She appears well-developed and well-nourished. No distress.  HENT:  Head: Normocephalic and atraumatic.  Right Ear: External ear normal. Normal ear canal and TM Left Ear: External ear normal.  Normal ear canal and TM Mouth/Throat: Oropharynx is clear and moist.  Eyes: Conjunctivae normal.  Neck: Neck supple. No tracheal deviation present. No thyromegaly present.  No carotid bruit  Cardiovascular: Normal rate, regular rhythm and normal heart sounds.   No murmur heard.  No edema. Pulmonary/Chest: Effort normal and breath sounds normal. No respiratory distress. She has no wheezes. She has no rales.  Breast: deferred   Abdominal: Soft. She exhibits no distension. There is no tenderness.  Lymphadenopathy: She has no cervical adenopathy.  Skin: Skin is warm and dry. She is not diaphoretic.  Psychiatric: She has a normal mood and affect. Her behavior is normal.     Lab Results  Component Value Date   WBC 6.5 05/23/2020   HGB 13.9 05/23/2020   HCT 42.5 05/23/2020   PLT 298 05/23/2020   GLUCOSE 89 05/23/2020   CHOL 216 (H) 08/08/2019   TRIG 71.0 08/08/2019   HDL 75.70 08/08/2019   LDLCALC 126 (H) 08/08/2019   ALT 15 08/08/2019   AST 18 08/08/2019  NA 139 05/23/2020   K 4.6 05/23/2020   CL 107 05/23/2020   CREATININE 0.67 05/23/2020   BUN 20 05/23/2020   CO2 24 05/23/2020   TSH 1.32 06/20/2018   The 10-year ASCVD risk score (Arnett DK, et al., 2019) is: 14.9%   Values used to calculate the score:     Age: 58 years     Sex: Female     Is Non-Hispanic African American: No     Diabetic: No     Tobacco smoker: No     Systolic Blood Pressure: 710 mmHg     Is BP treated: No     HDL Cholesterol: 75.7 mg/dL     Total Cholesterol: 216 mg/dL       Assessment & Plan:     Health Maintenance  Topic Date Due   Zoster Vaccines- Shingrix (2 of 2) 09/17/2016   INFLUENZA VACCINE  12/08/2021   MAMMOGRAM  12/31/2022   DEXA SCAN   12/31/2022   COLONOSCOPY (Pts 45-27yr Insurance coverage will need to be confirmed)  08/29/2024   TETANUS/TDAP  07/19/2029   Pneumonia Vaccine 76 Years old  Completed   COVID-19 Vaccine  Completed   Hepatitis C Screening  Completed   HPV VACCINES  Aged Out       I spent 20 minutes dedicated to the care of this patient on the date of this encounter including review of speciality notes, obtaining history, communicating with the patient, ordering medications, tests, and documenting clinical information in the EHR    See Problem List for Assessment and Plan of chronic medical problems.

## 2021-10-04 NOTE — Patient Instructions (Addendum)
     Blood work was ordered.     Medications changes include :   none     Return in about 1 year (around 10/07/2022) for follow up.

## 2021-10-06 ENCOUNTER — Ambulatory Visit (INDEPENDENT_AMBULATORY_CARE_PROVIDER_SITE_OTHER): Payer: Medicare Other | Admitting: Internal Medicine

## 2021-10-06 VITALS — BP 130/78 | HR 65 | Temp 98.0°F | Ht 62.0 in | Wt 142.2 lb

## 2021-10-06 DIAGNOSIS — R030 Elevated blood-pressure reading, without diagnosis of hypertension: Secondary | ICD-10-CM | POA: Diagnosis not present

## 2021-10-06 DIAGNOSIS — I7090 Unspecified atherosclerosis: Secondary | ICD-10-CM

## 2021-10-06 DIAGNOSIS — Z136 Encounter for screening for cardiovascular disorders: Secondary | ICD-10-CM | POA: Diagnosis not present

## 2021-10-06 DIAGNOSIS — E559 Vitamin D deficiency, unspecified: Secondary | ICD-10-CM

## 2021-10-06 DIAGNOSIS — E782 Mixed hyperlipidemia: Secondary | ICD-10-CM

## 2021-10-06 DIAGNOSIS — I771 Stricture of artery: Secondary | ICD-10-CM

## 2021-10-06 DIAGNOSIS — I700 Atherosclerosis of aorta: Secondary | ICD-10-CM | POA: Diagnosis not present

## 2021-10-06 LAB — COMPREHENSIVE METABOLIC PANEL
ALT: 16 U/L (ref 0–35)
AST: 20 U/L (ref 0–37)
Albumin: 4.2 g/dL (ref 3.5–5.2)
Alkaline Phosphatase: 72 U/L (ref 39–117)
BUN: 18 mg/dL (ref 6–23)
CO2: 24 mEq/L (ref 19–32)
Calcium: 9.3 mg/dL (ref 8.4–10.5)
Chloride: 108 mEq/L (ref 96–112)
Creatinine, Ser: 0.72 mg/dL (ref 0.40–1.20)
GFR: 82.45 mL/min (ref >60.00)
Glucose, Bld: 89 mg/dL (ref 70–99)
Potassium: 4.4 mEq/L (ref 3.5–5.1)
Sodium: 140 mEq/L (ref 135–145)
Total Bilirubin: 0.5 mg/dL (ref 0.2–1.2)
Total Protein: 6.9 g/dL (ref 6.0–8.3)

## 2021-10-06 LAB — CBC WITH DIFFERENTIAL/PLATELET
Basophils Absolute: 0.1 10*3/uL (ref 0.0–0.1)
Basophils Relative: 0.7 % (ref 0.0–3.0)
Eosinophils Absolute: 0.3 10*3/uL (ref 0.0–0.7)
Eosinophils Relative: 3.7 % (ref 0.0–5.0)
HCT: 41.9 % (ref 36.0–46.0)
Hemoglobin: 13.9 g/dL (ref 12.0–15.0)
Lymphocytes Relative: 27.7 % (ref 12.0–46.0)
Lymphs Abs: 2 10*3/uL (ref 0.7–4.0)
MCHC: 33.1 g/dL (ref 30.0–36.0)
MCV: 92.8 fl (ref 78.0–100.0)
Monocytes Absolute: 0.8 10*3/uL (ref 0.1–1.0)
Monocytes Relative: 10.6 % (ref 3.0–12.0)
Neutro Abs: 4.1 10*3/uL (ref 1.4–7.7)
Neutrophils Relative %: 57.3 % (ref 43.0–77.0)
Platelets: 249 10*3/uL (ref 150.0–400.0)
RBC: 4.52 Mil/uL (ref 3.87–5.11)
RDW: 13.8 % (ref 11.5–15.5)
WBC: 7.1 10*3/uL (ref 4.0–10.5)

## 2021-10-06 LAB — VITAMIN D 25 HYDROXY (VIT D DEFICIENCY, FRACTURES): VITD: 28.81 ng/mL — ABNORMAL LOW (ref 30.00–100.00)

## 2021-10-06 LAB — LIPID PANEL
Cholesterol: 186 mg/dL (ref 0–200)
HDL: 66.9 mg/dL (ref >39.00)
LDL Cholesterol: 106 mg/dL — ABNORMAL HIGH (ref 0–99)
NonHDL: 118.61
Total CHOL/HDL Ratio: 3
Triglycerides: 64 mg/dL (ref 0.0–149.0)
VLDL: 12.8 mg/dL (ref 0.0–40.0)

## 2021-10-06 NOTE — Assessment & Plan Note (Addendum)
Chronic BP borderline here today and has been borderline at times-improved upon repeat No need for medication Continue to monitor

## 2021-10-06 NOTE — Assessment & Plan Note (Signed)
Chronic Taking vitamin D daily Check vitamin D level  

## 2021-10-06 NOTE — Assessment & Plan Note (Signed)
Chronic Saw vascular and since she was asymptomatic no treatment is needed She has only noticed discomfort once in her left arm with activity.  Discussed that if this recurs or continues to recur then she will need to see vascular again

## 2021-10-06 NOTE — Assessment & Plan Note (Addendum)
Chronic Lifestyle controlled Discussed the benefits of taking a statin-she does have a elevated ASCVD risk and has known atherosclerosis no should consider a low-dose statin-maybe even Crestor 5 mg 3 times a week We will get a CT coronary artery scan to help determine further her risk Check lipid panel, CMP, CBC

## 2021-11-03 ENCOUNTER — Other Ambulatory Visit: Payer: Medicare Other

## 2021-11-03 DIAGNOSIS — Z23 Encounter for immunization: Secondary | ICD-10-CM | POA: Diagnosis not present

## 2022-01-05 DIAGNOSIS — Z1231 Encounter for screening mammogram for malignant neoplasm of breast: Secondary | ICD-10-CM | POA: Diagnosis not present

## 2022-01-06 LAB — HM MAMMOGRAPHY

## 2022-01-13 ENCOUNTER — Encounter: Payer: Self-pay | Admitting: Internal Medicine

## 2022-01-13 NOTE — Progress Notes (Signed)
Outside notes received. Information abstracted. Notes sent to scan.  

## 2022-02-08 DIAGNOSIS — Z23 Encounter for immunization: Secondary | ICD-10-CM | POA: Diagnosis not present

## 2022-03-10 DIAGNOSIS — Z23 Encounter for immunization: Secondary | ICD-10-CM | POA: Diagnosis not present

## 2022-03-23 DIAGNOSIS — H2513 Age-related nuclear cataract, bilateral: Secondary | ICD-10-CM | POA: Diagnosis not present

## 2022-03-23 DIAGNOSIS — H524 Presbyopia: Secondary | ICD-10-CM | POA: Diagnosis not present

## 2022-03-23 DIAGNOSIS — H40023 Open angle with borderline findings, high risk, bilateral: Secondary | ICD-10-CM | POA: Diagnosis not present

## 2022-04-27 DIAGNOSIS — D485 Neoplasm of uncertain behavior of skin: Secondary | ICD-10-CM | POA: Diagnosis not present

## 2022-04-27 DIAGNOSIS — L570 Actinic keratosis: Secondary | ICD-10-CM | POA: Diagnosis not present

## 2022-04-27 DIAGNOSIS — L820 Inflamed seborrheic keratosis: Secondary | ICD-10-CM | POA: Diagnosis not present

## 2022-07-19 ENCOUNTER — Encounter: Payer: Self-pay | Admitting: Internal Medicine

## 2022-07-19 DIAGNOSIS — Z136 Encounter for screening for cardiovascular disorders: Secondary | ICD-10-CM

## 2022-07-24 NOTE — Addendum Note (Signed)
Addended by: Binnie Rail on: 07/24/2022 09:08 PM   Modules accepted: Orders

## 2022-07-26 ENCOUNTER — Encounter: Payer: Self-pay | Admitting: Internal Medicine

## 2022-07-27 ENCOUNTER — Encounter: Payer: Self-pay | Admitting: Internal Medicine

## 2022-07-27 NOTE — Patient Instructions (Signed)
      Blood work was ordered.   The lab is on the first floor.    Medications changes include :       A referral was ordered for XXX.     Someone will call you to schedule an appointment.    No follow-ups on file.  

## 2022-07-27 NOTE — Progress Notes (Signed)
    Subjective:    Patient ID: Kelsey Dominguez, female    DOB: 08/27/1945, 77 y.o.   MRN: [id number]      HPI Kelsey Dominguez is here for No chief complaint on file.    Swelling top of left foot -     Medications and allergies reviewed with patient and updated if appropriate.  Current Outpatient Medications on File Prior to Visit  Medication Sig Dispense Refill   calcium-vitamin D (OSCAL WITH D) 500-200 MG-UNIT tablet Take 1 tablet by mouth daily.     Cholecalciferol (VITAMIN D3) 2000 units TABS Take 4,000 Units by mouth daily.      Glucosamine-Chondroit-Vit C-Mn (GLUCOSAMINE 1500 COMPLEX PO) Take 1,500 mg by mouth daily.     TURMERIC PO Take 1,000 mg by mouth daily.     vitamin B-12 (CYANOCOBALAMIN) 1000 MCG tablet Take 1,000 mcg by mouth daily.     No current facility-administered medications on file prior to visit.    Review of Systems     Objective:  There were no vitals filed for this visit. BP Readings from Last 3 Encounters:  10/06/21 130/78  10/25/20 (!) 148/100  07/04/20 136/84   Wt Readings from Last 3 Encounters:  10/06/21 142 lb 3.2 oz (64.5 kg)  07/04/20 140 lb (63.5 kg)  05/23/20 140 lb (63.5 kg)   There is no height or weight on file to calculate BMI.    Physical Exam         Assessment & Plan:    See Problem List for Assessment and Plan of chronic medical problems.     This encounter was created in error - please disregard.

## 2022-07-28 ENCOUNTER — Encounter: Payer: Medicare Other | Admitting: Internal Medicine

## 2022-08-01 NOTE — Progress Notes (Unsigned)
    Subjective:    Patient ID: Kelsey Dominguez, female    DOB: 11/03/1945, 77 y.o.   MRN: [id number]      HPI Kelsey Dominguez is here for No chief complaint on file.    Swelling top of left foot -     Medications and allergies reviewed with patient and updated if appropriate.  Current Outpatient Medications on File Prior to Visit  Medication Sig Dispense Refill   calcium-vitamin D (OSCAL WITH D) 500-200 MG-UNIT tablet Take 1 tablet by mouth daily.     Cholecalciferol (VITAMIN D3) 2000 units TABS Take 4,000 Units by mouth daily.      Glucosamine-Chondroit-Vit C-Mn (GLUCOSAMINE 1500 COMPLEX PO) Take 1,500 mg by mouth daily.     TURMERIC PO Take 1,000 mg by mouth daily.     vitamin B-12 (CYANOCOBALAMIN) 1000 MCG tablet Take 1,000 mcg by mouth daily.     No current facility-administered medications on file prior to visit.    Review of Systems     Objective:  There were no vitals filed for this visit. BP Readings from Last 3 Encounters:  10/06/21 130/78  10/25/20 (!) 148/100  07/04/20 136/84   Wt Readings from Last 3 Encounters:  10/06/21 142 lb 3.2 oz (64.5 kg)  07/04/20 140 lb (63.5 kg)  05/23/20 140 lb (63.5 kg)   There is no height or weight on file to calculate BMI.    Physical Exam         Assessment & Plan:    See Problem List for Assessment and Plan of chronic medical problems.

## 2022-08-02 ENCOUNTER — Ambulatory Visit (INDEPENDENT_AMBULATORY_CARE_PROVIDER_SITE_OTHER): Payer: Medicare Other

## 2022-08-02 ENCOUNTER — Ambulatory Visit (INDEPENDENT_AMBULATORY_CARE_PROVIDER_SITE_OTHER): Payer: Medicare Other | Admitting: Internal Medicine

## 2022-08-02 VITALS — BP 138/78 | HR 70 | Temp 98.1°F | Ht 62.0 in | Wt 144.0 lb

## 2022-08-02 DIAGNOSIS — M79672 Pain in left foot: Secondary | ICD-10-CM | POA: Insufficient documentation

## 2022-08-02 DIAGNOSIS — M7989 Other specified soft tissue disorders: Secondary | ICD-10-CM | POA: Diagnosis not present

## 2022-08-02 MED ORDER — MELOXICAM 7.5 MG PO TABS
7.5000 mg | ORAL_TABLET | Freq: Every day | ORAL | 0 refills | Status: DC
Start: 2022-08-02 — End: 2022-10-07

## 2022-08-02 NOTE — Patient Instructions (Addendum)
      An xray was ordered. Have this done downstairs.      Medications changes include :   meloxicam 7.5 mg daily with food.       Return if symptoms worsen or fail to improve.

## 2022-08-02 NOTE — Assessment & Plan Note (Signed)
Acute Pain at Left 3rd MTP joint No injury ? OA, gout, pseudogout Xray today Does not tolerate nsaids well - trial of meloxicam 7.5 mg daily with food - take for a day or two after symptoms stop Ice, voltaren gel

## 2022-08-03 ENCOUNTER — Encounter: Payer: Self-pay | Admitting: Internal Medicine

## 2022-08-31 DIAGNOSIS — Z23 Encounter for immunization: Secondary | ICD-10-CM | POA: Diagnosis not present

## 2022-10-07 NOTE — Patient Instructions (Addendum)
      Blood work was ordered.   The lab is on the first floor.    Medications changes include :   none    A ultrasound of your carotid arteries was ordered and someone will call you to schedule an appointment.     Return in about 1 year (around 10/08/2023) for Physical Exam.

## 2022-10-07 NOTE — Progress Notes (Signed)
Subjective:    Patient ID: Kelsey Dominguez, female    DOB: [birth year], 77 y.o.   MRN: [id number]     HPI Breeza is here for follow up of her chronic medical problems.  2 months ago coronary artery calcium score test ordered-she think she wants to get this done, but may wait until the fall when it is easier-[place]  Pain left posterior lateral hip down to knee.  Started two weeks ago.  A little tingling.  No lower back pain.  Ibuprofen helps.     Medications and allergies reviewed with patient and updated if appropriate.  Current Outpatient Medications on File Prior to Visit  Medication Sig Dispense Refill   calcium-vitamin D (OSCAL WITH D) 500-200 MG-UNIT tablet Take 1 tablet by mouth daily.     Cholecalciferol (VITAMIN D3) 2000 units TABS Take 4,000 Units by mouth daily.      Multiple Vitamin (MULTI-VITAMIN DAILY PO)      TURMERIC PO Take 1,000 mg by mouth daily.     vitamin B-12 (CYANOCOBALAMIN) 1000 MCG tablet Take 1,000 mcg by mouth daily.     No current facility-administered medications on file prior to visit.     Review of Systems  Constitutional:  Negative for fever.  Respiratory:  Negative for cough, shortness of breath and wheezing.   Cardiovascular:  Negative for chest pain, palpitations and leg swelling.  Gastrointestinal:  Negative for abdominal pain, blood in stool, constipation, diarrhea and nausea.       No  gerd  Musculoskeletal:  Positive for arthralgias (right shoulder).       Left posterior lateral leg pain  Neurological:  Negative for light-headedness and headaches.       Objective:   Vitals:   10/08/22 0851  BP: 130/78  Pulse: 70  Temp: 98 F (36.7 C)  SpO2: 98%   BP Readings from Last 3 Encounters:  10/08/22 130/78  08/02/22 138/78  10/06/21 130/78   Wt Readings from Last 3 Encounters:  10/08/22 143 lb (64.9 kg)  08/02/22 144 lb (65.3 kg)  10/06/21 142 lb 3.2 oz (64.5 kg)   Body mass index is  26.16 kg/m.    Physical Exam Constitutional:      General: She is not in acute distress.    Appearance: Normal appearance.  HENT:     Head: Normocephalic and atraumatic.  Eyes:     Conjunctiva/sclera: Conjunctivae normal.  Cardiovascular:     Rate and Rhythm: Normal rate and regular rhythm.     Heart sounds: Normal heart sounds.  Pulmonary:     Effort: Pulmonary effort is normal. No respiratory distress.     Breath sounds: Normal breath sounds. No wheezing.  Abdominal:     General: There is no distension.     Palpations: Abdomen is soft.     Tenderness: There is no abdominal tenderness.  Musculoskeletal:     Cervical back: Neck supple.     Right lower leg: No edema.     Left lower leg: No edema.  Lymphadenopathy:     Cervical: No cervical adenopathy.  Skin:    General: Skin is warm and dry.     Findings: No rash.  Neurological:     Mental Status: She is alert. Mental status is at baseline.  Psychiatric:        Mood and Affect: Mood normal.        Behavior: Behavior normal.  Lab Results  Component Value Date   WBC 7.1 10/06/2021   HGB 13.9 10/06/2021   HCT 41.9 10/06/2021   PLT 249.0 10/06/2021   GLUCOSE 89 10/06/2021   CHOL 186 10/06/2021   TRIG 64.0 10/06/2021   HDL 66.90 10/06/2021   LDLCALC 106 (H) 10/06/2021   ALT 16 10/06/2021   AST 20 10/06/2021   NA 140 10/06/2021   K 4.4 10/06/2021   CL 108 10/06/2021   CREATININE 0.72 10/06/2021   BUN 18 10/06/2021   CO2 24 10/06/2021   TSH 1.32 06/20/2018     Assessment & Plan:    See Problem List for Assessment and Plan of chronic medical problems.   I spent 20 minutes dedicated to the care of this patient on the date of this encounter including review of last years labs, imaging and procedures, obtaining history, communicating with the patient, ordering  tests, and documenting clinical information in the EHR

## 2022-10-08 ENCOUNTER — Ambulatory Visit (INDEPENDENT_AMBULATORY_CARE_PROVIDER_SITE_OTHER): Payer: Medicare Other | Admitting: Internal Medicine

## 2022-10-08 ENCOUNTER — Encounter: Payer: Self-pay | Admitting: Internal Medicine

## 2022-10-08 VITALS — BP 130/78 | HR 70 | Temp 98.0°F | Ht 62.0 in | Wt 143.0 lb

## 2022-10-08 DIAGNOSIS — I771 Stricture of artery: Secondary | ICD-10-CM

## 2022-10-08 DIAGNOSIS — M8589 Other specified disorders of bone density and structure, multiple sites: Secondary | ICD-10-CM | POA: Diagnosis not present

## 2022-10-08 DIAGNOSIS — M79605 Pain in left leg: Secondary | ICD-10-CM | POA: Diagnosis not present

## 2022-10-08 DIAGNOSIS — E559 Vitamin D deficiency, unspecified: Secondary | ICD-10-CM | POA: Diagnosis not present

## 2022-10-08 DIAGNOSIS — E782 Mixed hyperlipidemia: Secondary | ICD-10-CM | POA: Diagnosis not present

## 2022-10-08 LAB — COMPREHENSIVE METABOLIC PANEL
ALT: 15 U/L (ref 0–35)
AST: 16 U/L (ref 0–37)
Albumin: 4.2 g/dL (ref 3.5–5.2)
Alkaline Phosphatase: 80 U/L (ref 39–117)
BUN: 27 mg/dL — ABNORMAL HIGH (ref 6–23)
CO2: 24 mEq/L (ref 19–32)
Calcium: 9.1 mg/dL (ref 8.4–10.5)
Chloride: 110 mEq/L (ref 96–112)
Creatinine, Ser: 0.8 mg/dL (ref 0.40–1.20)
GFR: 72.15 mL/min (ref >60.00)
Glucose, Bld: 88 mg/dL (ref 70–99)
Potassium: 4.3 mEq/L (ref 3.5–5.1)
Sodium: 143 mEq/L (ref 135–145)
Total Bilirubin: 0.3 mg/dL (ref 0.2–1.2)
Total Protein: 7 g/dL (ref 6.0–8.3)

## 2022-10-08 LAB — CBC WITH DIFFERENTIAL/PLATELET
Basophils Absolute: 0 10*3/uL (ref 0.0–0.1)
Basophils Relative: 0.6 % (ref 0.0–3.0)
Eosinophils Absolute: 0.2 10*3/uL (ref 0.0–0.7)
Eosinophils Relative: 3 % (ref 0.0–5.0)
HCT: 40.8 % (ref 36.0–46.0)
Hemoglobin: 13.4 g/dL (ref 12.0–15.0)
Lymphocytes Relative: 22.3 % (ref 12.0–46.0)
Lymphs Abs: 1.8 10*3/uL (ref 0.7–4.0)
MCHC: 33 g/dL (ref 30.0–36.0)
MCV: 92.1 fl (ref 78.0–100.0)
Monocytes Absolute: 0.8 10*3/uL (ref 0.1–1.0)
Monocytes Relative: 10.1 % (ref 3.0–12.0)
Neutro Abs: 5.3 10*3/uL (ref 1.4–7.7)
Neutrophils Relative %: 64 % (ref 43.0–77.0)
Platelets: 289 10*3/uL (ref 150.0–400.0)
RBC: 4.42 Mil/uL (ref 3.87–5.11)
RDW: 13.9 % (ref 11.5–15.5)
WBC: 8.2 10*3/uL (ref 4.0–10.5)

## 2022-10-08 LAB — TSH: TSH: 2.09 u[IU]/mL (ref 0.35–5.50)

## 2022-10-08 LAB — LIPID PANEL
Cholesterol: 178 mg/dL (ref 0–200)
HDL: 68 mg/dL (ref >39.00)
LDL Cholesterol: 94 mg/dL (ref 0–99)
NonHDL: 109.52
Total CHOL/HDL Ratio: 3
Triglycerides: 80 mg/dL (ref 0.0–149.0)
VLDL: 16 mg/dL (ref 0.0–40.0)

## 2022-10-08 LAB — VITAMIN D 25 HYDROXY (VIT D DEFICIENCY, FRACTURES): VITD: 39.45 ng/mL (ref 30.00–100.00)

## 2022-10-08 NOTE — Assessment & Plan Note (Signed)
Chronic Taking vitamin D daily Check vitamin D level  

## 2022-10-08 NOTE — Assessment & Plan Note (Signed)
New Started 2 weeks ago-possibly related to gardening Pain is in the upper posterior-lateral proximal left leg and radiates toward the knee Possibly some tingling ?  Lumbar radiculopathy versus bursitis, muscle strain Less likely hip arthritis She is taking ibuprofen and it seems to be helping Advised activities Can see if her symptoms improve over the next couple weeks-if not would recommend seeing sports medicine

## 2022-10-08 NOTE — Assessment & Plan Note (Signed)
Chronic DEXA up-to-date-due in a few months Has been ordered by her gynecologist Stressed the importance of regular exercise Continue calcium and vitamin D daily Check vitamin D level

## 2022-10-08 NOTE — Assessment & Plan Note (Addendum)
Chronic Saw vascular and since she was asymptomatic no treatment is needed She has not noticed discomfort once in her left arm with activity.   Carotid ultrasound ordered to reevaluate

## 2022-10-08 NOTE — Assessment & Plan Note (Addendum)
Chronic Lifestyle controlled Discussed the benefits of taking a statin-she does have a elevated ASCVD risk and has known atherosclerosis no should consider a low-dose statin-maybe even Crestor 5 mg 3 times a week CT coronary artery scan ordered, but she has not scheduled this-she can schedule it if she wants for the fall when she is back in town Check lipid panel, CMP, CBC

## 2022-10-18 DIAGNOSIS — H2513 Age-related nuclear cataract, bilateral: Secondary | ICD-10-CM | POA: Diagnosis not present

## 2022-10-18 DIAGNOSIS — H40023 Open angle with borderline findings, high risk, bilateral: Secondary | ICD-10-CM | POA: Diagnosis not present

## 2022-10-27 ENCOUNTER — Encounter (HOSPITAL_COMMUNITY): Payer: Medicare Other

## 2022-11-15 ENCOUNTER — Encounter (HOSPITAL_COMMUNITY): Payer: Medicare Other

## 2022-11-23 ENCOUNTER — Encounter: Payer: Self-pay | Admitting: Internal Medicine

## 2022-11-29 NOTE — Progress Notes (Unsigned)
    Kelsey Dominguez D.[facility] [address] [place] [address] Phone: [phone]   Assessment and Plan:     There are no diagnoses linked to this encounter.  ***   Pertinent previous records reviewed include ***   Follow Up: ***     Subjective:   I, Kelsey Dominguez, am serving as a Neurosurgeon for Doctor Kelsey Dominguez  Chief Complaint: left leg pain   HPI:   11/30/2022 Patient is a 77 year old female complaining of left leg pain. Patient states  Relevant Historical Information: ***  Additional pertinent review of systems negative.   Current Outpatient Medications:    calcium-vitamin D (OSCAL WITH D) 500-200 MG-UNIT tablet, Take 1 tablet by mouth daily., Disp: , Rfl:    Cholecalciferol (VITAMIN D3) 2000 units TABS, Take 4,000 Units by mouth daily. , Disp: , Rfl:    Multiple Vitamin (MULTI-VITAMIN DAILY PO), , Disp: , Rfl:    TURMERIC PO, Take 1,000 mg by mouth daily., Disp: , Rfl:    vitamin B-12 (CYANOCOBALAMIN) 1000 MCG tablet, Take 1,000 mcg by mouth daily., Disp: , Rfl:    Objective:     There were no vitals filed for this visit.    There is no height or weight on file to calculate BMI.    Physical Exam:    ***   Electronically signed by:  Kelsey Dominguez D.[facility] 7:09 AM 11/29/22

## 2022-11-30 ENCOUNTER — Ambulatory Visit (INDEPENDENT_AMBULATORY_CARE_PROVIDER_SITE_OTHER): Payer: Medicare Other | Admitting: Sports Medicine

## 2022-11-30 ENCOUNTER — Ambulatory Visit (INDEPENDENT_AMBULATORY_CARE_PROVIDER_SITE_OTHER): Payer: Medicare Other

## 2022-11-30 VITALS — BP 122/78 | HR 72 | Ht 62.0 in | Wt 142.0 lb

## 2022-11-30 DIAGNOSIS — G8929 Other chronic pain: Secondary | ICD-10-CM

## 2022-11-30 DIAGNOSIS — M5442 Lumbago with sciatica, left side: Secondary | ICD-10-CM

## 2022-11-30 DIAGNOSIS — M545 Low back pain, unspecified: Secondary | ICD-10-CM | POA: Diagnosis not present

## 2022-11-30 DIAGNOSIS — M25552 Pain in left hip: Secondary | ICD-10-CM

## 2022-11-30 DIAGNOSIS — M438X5 Other specified deforming dorsopathies, thoracolumbar region: Secondary | ICD-10-CM | POA: Diagnosis not present

## 2022-11-30 DIAGNOSIS — M1612 Unilateral primary osteoarthritis, left hip: Secondary | ICD-10-CM

## 2022-11-30 DIAGNOSIS — M5136 Other intervertebral disc degeneration, lumbar region: Secondary | ICD-10-CM | POA: Diagnosis not present

## 2022-11-30 NOTE — Patient Instructions (Signed)
-   Start naproxen 220  daily x2 a day.  If still having pain after 2 weeks, complete 3rd-week of naproxen. May use remaining naproxen  as needed once daily for pain control.  Do not to use additional NSAIDs while taking meloxicam.  May use Tylenol [phone] mg 2 to 3 times a day for breakthrough pain. Low back HEP 4-6 week follow up

## 2022-12-20 ENCOUNTER — Encounter (HOSPITAL_COMMUNITY): Payer: Self-pay

## 2022-12-30 ENCOUNTER — Encounter: Payer: Self-pay | Admitting: Internal Medicine

## 2023-01-24 ENCOUNTER — Ambulatory Visit (HOSPITAL_COMMUNITY)
Admission: RE | Admit: 2023-01-24 | Discharge: 2023-01-24 | Disposition: A | Discharge status: Home / Self Care | Payer: Medicare Other | Source: Ambulatory Visit | Attending: Cardiology | Admitting: Cardiology

## 2023-01-24 DIAGNOSIS — I771 Stricture of artery: Secondary | ICD-10-CM | POA: Diagnosis present

## 2023-01-24 DIAGNOSIS — I6522 Occlusion and stenosis of left carotid artery: Secondary | ICD-10-CM | POA: Diagnosis present

## 2023-01-26 ENCOUNTER — Encounter: Payer: Self-pay | Admitting: Internal Medicine

## 2023-01-26 DIAGNOSIS — I6522 Occlusion and stenosis of left carotid artery: Secondary | ICD-10-CM | POA: Insufficient documentation

## 2023-02-19 DIAGNOSIS — Z23 Encounter for immunization: Secondary | ICD-10-CM | POA: Diagnosis not present

## 2023-03-05 DIAGNOSIS — Z23 Encounter for immunization: Secondary | ICD-10-CM | POA: Diagnosis not present

## 2023-03-14 DIAGNOSIS — R29890 Loss of height: Secondary | ICD-10-CM | POA: Diagnosis not present

## 2023-03-14 DIAGNOSIS — M8580 Other specified disorders of bone density and structure, unspecified site: Secondary | ICD-10-CM | POA: Diagnosis not present

## 2023-03-14 DIAGNOSIS — M8588 Other specified disorders of bone density and structure, other site: Secondary | ICD-10-CM | POA: Diagnosis not present

## 2023-03-14 DIAGNOSIS — Z1231 Encounter for screening mammogram for malignant neoplasm of breast: Secondary | ICD-10-CM | POA: Diagnosis not present

## 2023-03-14 LAB — HM DEXA SCAN

## 2023-03-14 LAB — HM MAMMOGRAPHY

## 2023-03-15 ENCOUNTER — Encounter: Payer: Self-pay | Admitting: Internal Medicine

## 2023-03-16 ENCOUNTER — Encounter: Payer: Self-pay | Admitting: Internal Medicine

## 2023-03-27 ENCOUNTER — Encounter: Payer: Self-pay | Admitting: Internal Medicine

## 2023-03-27 NOTE — Progress Notes (Unsigned)
      Subjective:    Patient ID: Kelsey Dominguez, female    DOB: June 20, 1945, 77 y.o.   MRN: [id number]     HPI Kelsey Dominguez is here for follow up about osteoporosis - recent dexa.  03/14/2023:  LFN -1.4,  Right one third radius  -2.3, right  Distal radius and ulna  -1.8     FRAX 17%,  3.1 %  She is taking calcium and vitamin d daily  -- takes 600 mg of calcium daily, gets a fair amount of dairy in her diet.   She is exercising.   Walks, sometimes classes with strength training.     Medications and allergies reviewed with patient and updated if appropriate.  Current Outpatient Medications on File Prior to Visit  Medication Sig Dispense Refill   calcium-vitamin D (OSCAL WITH D) 500-200 MG-UNIT tablet Take 1 tablet by mouth daily.     Cholecalciferol (VITAMIN D3) 2000 units TABS Take 4,000 Units by mouth daily.      Multiple Vitamin (MULTI-VITAMIN DAILY PO)      TURMERIC PO Take 1,000 mg by mouth daily.     vitamin B-12 (CYANOCOBALAMIN) 1000 MCG tablet Take 1,000 mcg by mouth daily.     No current facility-administered medications on file prior to visit.     Review of Systems     Objective:   Vitals:   03/28/23 0852  BP: 128/76  Pulse: 79  Temp: 98.2 F (36.8 C)  SpO2: 95%   BP Readings from Last 3 Encounters:  03/28/23 128/76  11/30/22 122/78  10/08/22 130/78   Wt Readings from Last 3 Encounters:  03/28/23 143 lb (64.9 kg)  11/30/22 142 lb (64.4 kg)  10/08/22 143 lb (64.9 kg)   Body mass index is 26.16 kg/m.    Physical Exam Constitutional:      General: She is not in acute distress.    Appearance: Normal appearance. She is not ill-appearing.  HENT:     Head: Normocephalic and atraumatic.  Skin:    General: Skin is warm and dry.  Neurological:     Mental Status: She is alert. Mental status is at baseline.        Lab Results  Component Value Date   WBC 8.2 10/08/2022   HGB 13.4 10/08/2022   HCT 40.8 10/08/2022   PLT 289.0 10/08/2022   GLUCOSE 88  10/08/2022   CHOL 178 10/08/2022   TRIG 80.0 10/08/2022   HDL 68.00 10/08/2022   LDLCALC 94 10/08/2022   ALT 15 10/08/2022   AST 16 10/08/2022   NA 143 10/08/2022   K 4.3 10/08/2022   CL 110 10/08/2022   CREATININE 0.80 10/08/2022   BUN 27 (H) 10/08/2022   CO2 24 10/08/2022   TSH 2.09 10/08/2022     Assessment & Plan:    See Problem List for Assessment and Plan of chronic medical problems.     25 minutes were spent face-to-face with the patient, over 50% of which was spent counseling regarding her severe osteopenia with a high FRAX, including causes of decreased bone density and risk of fracture.  We discussed treatment with calcium/vitamin d, exercise and medication options including fosamax, prolia, reclast and forteo.  Each medication was discussed including the possible side effects.

## 2023-03-27 NOTE — Patient Instructions (Incomplete)
Alendronate Tablets - Fosamax What is this medication? ALENDRONATE (a LEN droe nate) prevents and treats osteoporosis. It may also be used to treat Paget disease of the bone. It works by Interior and spatial designer stronger and less likely to break (fracture). It belongs to a group of medications called bisphosphonates. This medicine may be used for other purposes; ask your health care provider or pharmacist if you have questions. COMMON BRAND NAME(S): Fosamax What should I tell my care team before I take this medication? They need to know if you have any of these conditions: Bleeding disorder Cancer Dental disease Difficulty swallowing Infection (fever, chills, cough, sore throat, pain or trouble passing urine) Kidney disease Low levels of calcium or other minerals in the blood Low red blood cell counts Receiving steroids like dexamethasone or prednisone Stomach or intestine problems Trouble sitting or standing for 30 minutes An unusual or allergic reaction to alendronate, other medications, foods, dyes or preservatives Pregnant or trying to get pregnant Breast-feeding How should I use this medication? Take this medication by mouth with a full glass of water. Take it as directed on the prescription label at the same time every day. Take the dose right after waking up. Do not eat or drink anything before taking it. Do not take it with any other drink except water. Do not chew or crush the tablet. After taking it, do not eat breakfast, drink, or take any other medications or vitamins for at least 30 minutes. Sit or stand up for at least 30 minutes after you take it. Do not lie down. Keep taking it unless your care team tells you to stop. A special MedGuide will be given to you by the pharmacist with each prescription and refill. Be sure to read this information carefully each time. Talk to your care team about the use of this medication in children. Special care may be needed. Overdosage: If you  think you have taken too much of this medicine contact a poison control center or emergency room at once. NOTE: This medicine is only for you. Do not share this medicine with others. What if I miss a dose? If you take your medication once a day, skip it. Take your next dose at the scheduled time the next morning. Do not take two doses on the same day. If you take your medication once a week, take the missed dose on the morning after you remember. Do not take two doses on the same day. What may interact with this medication? Aluminum hydroxide Antacids Aspirin Calcium supplements Medications for inflammation like ibuprofen, naproxen, and others Iron supplements Magnesium supplements Vitamins with minerals This list may not describe all possible interactions. Give your health care provider a list of all the medicines, herbs, non-prescription drugs, or dietary supplements you use. Also tell them if you smoke, drink alcohol, or use illegal drugs. Some items may interact with your medicine. What should I watch for while using this medication? Visit your care team for regular checks on your progress. It may be some time before you see the benefit from this medication. Some people who take this medication have severe bone, joint, or muscle pain. This medication may also increase your risk for jaw problems or a broken thigh bone. Tell your care team right away if you have severe pain in your jaw, bones, joints, or muscles. Tell you care team if you have any pain that does not go away or that gets worse. Tell your dentist and dental surgeon that  you are taking this medication. You should not have major dental surgery while on this medication. See your dentist to have a dental exam and fix any dental problems before starting this medication. Take good care of your teeth while on this medication. Make sure you see your dentist for regular follow-up appointments. You should make sure you get enough calcium  and vitamin D while you are taking this medication. Discuss the foods you eat and the vitamins you take with your care team. You may need blood work done while you are taking this medication. What side effects may I notice from receiving this medication? Side effects that you should report to your care team as soon as possible: Allergic reactions--skin rash, itching, hives, swelling of the face, lips, tongue, or throat Low calcium level--muscle pain or cramps, confusion, tingling, or numbness in the hands or feet Osteonecrosis of the jaw--pain, swelling, or redness in the mouth, numbness of the jaw, poor healing after dental work, unusual discharge from the mouth, visible bones in the mouth Pain or trouble swallowing Severe bone, joint, or muscle pain Stomach bleeding--bloody or black, tar-like stools, vomiting blood or brown material that looks like coffee grounds Side effects that usually do not require medical attention (report to your care team if they continue or are bothersome): Constipation Diarrhea Nausea Stomach pain This list may not describe all possible side effects. Call your doctor for medical advice about side effects. You may report side effects to FDA at 1-800-FDA-1088. Where should I keep my medication? Keep out of the reach of children and pets. Store at room temperature between 15 and 30 degrees C (59 and 86 degrees F). Throw away any unused medication after the expiration date. NOTE: This sheet is a summary. It may not cover all possible information. If you have questions about this medicine, talk to your doctor, pharmacist, or health care provider.  2024 Elsevier/Gold Standard (2020-05-08 00:00:00)      Osteoporosis  Osteoporosis is when the bones get thin and weak. This can cause your bones to break (fracture) more easily. What are the causes? The exact cause of this condition is not known. What increases the risk? Having family members with this  condition. Not eating enough healthy foods. Taking certain medicines. Being female. Being age 40 or older. Smoking or using other products that contain nicotine or tobacco, such as e-cigarettes or chewing tobacco. Not exercising. Being of European or Asian ancestry. Having a small body frame. What are the signs or symptoms? A broken bone might be the first sign, especially if the break results from a fall or injury that usually would not cause a bone to break. Other signs and symptoms include: Pain in the neck or low back. Being hunched over (stooped posture). Getting shorter. How is this treated? Eating more foods with more calcium and vitamin D in them. Doing exercises. Stopping tobacco use. Limiting how much alcohol you drink. Taking medicines to slow bone loss or help make the bones stronger. Taking supplements of calcium and vitamin D every day. Taking medicines to replace chemicals in the body (hormone replacement medicines). Monitoring your levels of calcium and vitamin D. The goal of treatment is to strengthen your bones and lower your risk for a bone break. Follow these instructions at home: Eating and drinking Eat plenty of calcium and vitamin D. These nutrients are good for your bones. Good sources of calcium and vitamin D include: Some fish, such as salmon and tuna. Foods that have calcium  and vitamin D added to them (fortified foods), such as some breakfast cereals. Egg yolks. Cheese. Liver.  Activity Do exercises as told by your doctor. Ask your doctor what exercises are safe for you. You should do: Exercises that make your muscles work to hold your body weight up (weight-bearing exercises). These include tai chi, yoga, and walking. Exercises to make your muscles stronger. One example is lifting weights. Lifestyle Do not drink alcohol if: Your doctor tells you not to drink. You are pregnant, may be pregnant, or are planning to become pregnant. If you drink  alcohol: Limit how much you use to: 0-1 drink a day for women. 0-2 drinks a day for men. Know how much alcohol is in your drink. In the [place] one drink equals one 12 oz bottle of beer (355 mL), one 5 oz glass of wine (148 mL), or one 1 oz glass of hard liquor (44 mL). Do not smoke or use any products that contain nicotine or tobacco. If you need help quitting, ask your doctor. Preventing falls Use tools to help you move around (mobility aids) as needed. These include canes, walkers, scooters, and crutches. Keep rooms well-lit. Put away things on the floor that could make you trip. These include cords and rugs. Install safety rails on stairs. Install grab bars in bathrooms. Use rubber mats in slippery areas, like bathrooms. Wear shoes that: Fit you well. Support your feet. Have closed toes. Have rubber soles or low heels. Tell your doctor about all of the medicines you are taking. Some medicines can make you more likely to fall. General instructions Take over-the-counter and prescription medicines only as told by your doctor. Keep all follow-up visits. Contact a doctor if: You have not been tested (screened) for osteoporosis and you are: A woman who is age 108 or older. A man who is age 22 or older. Get help right away if: You fall. You get hurt. Summary Osteoporosis happens when your bones get thin and weak. Weak bones can break (fracture) more easily. Eat plenty of calcium and vitamin D. These are good for your bones. Tell your doctor about all of the medicines that you take. This information is not intended to replace advice given to you by your health care provider. Make sure you discuss any questions you have with your health care provider. Document Revised: 10/11/2019 Document Reviewed: 10/11/2019 Elsevier Patient Education  2024 [employer].

## 2023-03-28 ENCOUNTER — Ambulatory Visit (INDEPENDENT_AMBULATORY_CARE_PROVIDER_SITE_OTHER): Payer: Medicare Other | Admitting: Internal Medicine

## 2023-03-28 ENCOUNTER — Encounter: Payer: Self-pay | Admitting: Internal Medicine

## 2023-03-28 VITALS — BP 128/76 | HR 79 | Temp 98.2°F | Ht 62.0 in | Wt 143.0 lb

## 2023-03-28 DIAGNOSIS — Z136 Encounter for screening for cardiovascular disorders: Secondary | ICD-10-CM

## 2023-03-28 DIAGNOSIS — M8589 Other specified disorders of bone density and structure, multiple sites: Secondary | ICD-10-CM

## 2023-03-28 NOTE — Assessment & Plan Note (Signed)
New History of osteopenia-her recent bone density showed elevated FRAX for her hip of 3.1% Reviewed DEXA scan with her and her increased risk of having a hip fracture over the next 10 years She is taking calcium 600 mg daily and vitamin D daily Vitamin D level has been checked and is good Discussed high calcium diet She is exercising regularly-walking and does not exercise class with strength training once a week-encouraged additional strength training Discussed treatment options and why we are considering treatment options for her-discussed Fosamax, Reclast, Prolia, Forteo, Evenity and possible side effects She would like to go ahead with Fosamax, but will likely start next year-she will read open material I gave her regarding the medication and will let me know when she is ready to start the medication DEXA scans every 2 years

## 2023-04-01 ENCOUNTER — Encounter: Payer: Self-pay | Admitting: Internal Medicine

## 2023-04-04 ENCOUNTER — Telehealth: Payer: Self-pay

## 2023-04-04 NOTE — Telephone Encounter (Signed)
Prolia VOB initiated via [email or web address] 

## 2023-04-06 ENCOUNTER — Ambulatory Visit (HOSPITAL_COMMUNITY)
Admission: RE | Admit: 2023-04-06 | Discharge: 2023-04-06 | Disposition: A | Discharge status: Home / Self Care | Payer: Medicare Other | Source: Ambulatory Visit | Attending: Internal Medicine | Admitting: Internal Medicine

## 2023-04-06 DIAGNOSIS — Z136 Encounter for screening for cardiovascular disorders: Secondary | ICD-10-CM | POA: Insufficient documentation

## 2023-04-12 ENCOUNTER — Encounter: Payer: Self-pay | Admitting: Internal Medicine

## 2023-04-12 NOTE — Telephone Encounter (Signed)
Noted. mychart message sent to patient

## 2023-04-12 NOTE — Telephone Encounter (Signed)
Pharmacy Patient Advocate Encounter  Received notification from  [employer] portal  that Prolia is not covered through your patient's medical benefit at this time, or the proposed use of Prolia is not consistent with the payer's policy and therefore is not covered, because diagnosis is not covered.   DX: M85.80 - Osteopenia with high frax

## 2023-04-19 DIAGNOSIS — H2513 Age-related nuclear cataract, bilateral: Secondary | ICD-10-CM | POA: Diagnosis not present

## 2023-04-19 DIAGNOSIS — H40023 Open angle with borderline findings, high risk, bilateral: Secondary | ICD-10-CM | POA: Diagnosis not present

## 2023-04-19 DIAGNOSIS — H524 Presbyopia: Secondary | ICD-10-CM | POA: Diagnosis not present

## 2023-04-19 DIAGNOSIS — H5203 Hypermetropia, bilateral: Secondary | ICD-10-CM | POA: Diagnosis not present

## 2023-04-19 DIAGNOSIS — H04123 Dry eye syndrome of bilateral lacrimal glands: Secondary | ICD-10-CM | POA: Diagnosis not present

## 2023-04-19 DIAGNOSIS — H25013 Cortical age-related cataract, bilateral: Secondary | ICD-10-CM | POA: Diagnosis not present

## 2023-04-19 DIAGNOSIS — H43813 Vitreous degeneration, bilateral: Secondary | ICD-10-CM | POA: Diagnosis not present

## 2023-04-21 DIAGNOSIS — L820 Inflamed seborrheic keratosis: Secondary | ICD-10-CM | POA: Diagnosis not present

## 2023-04-21 DIAGNOSIS — L570 Actinic keratosis: Secondary | ICD-10-CM | POA: Diagnosis not present

## 2023-04-21 DIAGNOSIS — L821 Other seborrheic keratosis: Secondary | ICD-10-CM | POA: Diagnosis not present

## 2023-04-21 NOTE — Telephone Encounter (Signed)
Completed.

## 2023-05-24 ENCOUNTER — Other Ambulatory Visit (HOSPITAL_COMMUNITY): Payer: Self-pay | Admitting: Internal Medicine

## 2023-05-24 DIAGNOSIS — I739 Peripheral vascular disease, unspecified: Secondary | ICD-10-CM

## 2023-06-09 ENCOUNTER — Ambulatory Visit (HOSPITAL_COMMUNITY)
Admission: RE | Admit: 2023-06-09 | Discharge: 2023-06-09 | Disposition: A | Discharge status: Home / Self Care | Payer: Medicare Other | Source: Ambulatory Visit | Attending: Cardiology | Admitting: Cardiology

## 2023-06-09 DIAGNOSIS — I739 Peripheral vascular disease, unspecified: Secondary | ICD-10-CM

## 2023-07-08 ENCOUNTER — Encounter: Payer: Self-pay | Admitting: Internal Medicine

## 2023-07-15 ENCOUNTER — Telehealth: Payer: Self-pay

## 2023-07-15 DIAGNOSIS — M8589 Other specified disorders of bone density and structure, multiple sites: Secondary | ICD-10-CM

## 2023-07-15 MED ORDER — DENOSUMAB 60 MG/ML ~~LOC~~ SOSY
60.0000 mg | PREFILLED_SYRINGE | Freq: Once | SUBCUTANEOUS | Status: AC
  Administered 2023-08-29: 60 mg via SUBCUTANEOUS

## 2023-07-15 NOTE — Telephone Encounter (Signed)
 Patient last PA run in 2024 needs up dated PA for 2025 for Prolia. Below is message from patient on 07/08/23 regarding insurance.  You have previously advised me to take a drug for bone health. We talked about either Prolia or fosamax. But you said in a message to me on 04/12/23 that Prolia was not covered by my insurance. As I understand it Prolia is an injectable administered twice a year. I have Medicare Part B and a Medicare Supplement policy (currently Plan G). I also have a Part D PDP. The current Medicare Guide book states in part "Part B covers . Marland Kitchen .Most injectable and infused drugs when a licensed medical provider gives them." I have Medicare friends on Prolia that is covered (they get them at [facility]).

## 2023-07-18 ENCOUNTER — Telehealth: Payer: Self-pay

## 2023-07-18 ENCOUNTER — Other Ambulatory Visit (HOSPITAL_COMMUNITY): Payer: Self-pay

## 2023-07-18 NOTE — Telephone Encounter (Signed)
 Prolia VOB initiated via [email or web address]

## 2023-07-18 NOTE — Telephone Encounter (Signed)
 Benefit verification started. Will update referral once complete.

## 2023-07-22 ENCOUNTER — Other Ambulatory Visit (HOSPITAL_BASED_OUTPATIENT_CLINIC_OR_DEPARTMENT_OTHER): Payer: Self-pay

## 2023-07-22 DIAGNOSIS — Z23 Encounter for immunization: Secondary | ICD-10-CM | POA: Diagnosis not present

## 2023-07-22 MED ORDER — CAPVAXIVE 0.5 ML IM SOSY
PREFILLED_SYRINGE | INTRAMUSCULAR | 0 refills | Status: AC
  Filled 2023-07-22: qty 0.5, 1d supply, fill #0

## 2023-08-02 ENCOUNTER — Encounter (HOSPITAL_COMMUNITY): Payer: Self-pay | Admitting: Cardiology

## 2023-08-02 NOTE — Telephone Encounter (Signed)
 M85.89 - Osteopenia of multiple sites diagnosis is not covered by Medicare

## 2023-08-03 NOTE — Telephone Encounter (Signed)
 Needs to be resubmitted - prolia is only approved for osteoporosis and osteopenia with high frax is an equivalent but needs to be submitted for osteoporosis

## 2023-08-04 NOTE — Telephone Encounter (Signed)
 Resubmitted in [employer] with M81.0 diagnosis

## 2023-08-05 ENCOUNTER — Other Ambulatory Visit (HOSPITAL_COMMUNITY): Payer: Self-pay

## 2023-08-05 NOTE — Telephone Encounter (Signed)
 Kelsey Dominguez

## 2023-08-05 NOTE — Telephone Encounter (Signed)
 Pt ready for scheduling for PROLIA on or after : 08/05/23  Option# 1: Buy/Bill (Office supplied medication)  Out-of-pocket cost due at time of clinic visit: $257  Number of injection/visits approved: ---  Primary: MEDICARE Prolia co-insurance: 0% Admin fee co-insurance: 0%  Secondary: [employer] Prolia co-insurance: covers the Medicare Part B co-insurance and 100% of the excess charges. This plan does not cover the Medicare Part B deductible of $257.00 which $0.00 is met. Admin fee co-insurance:   Medical Benefit Details: Date Benefits were checked: 08/04/23 Deductible: $0 Met of $257 Required/ Coinsurance: 0%/ Admin Fee: 0%  Prior Auth: N/A PA# Expiration Date:   # of doses approved: ----------------------------------------------------------------------- Option# 2- Med Obtained from pharmacy:  Pharmacy benefit: Copay $1068.08 (Paid to pharmacy) Admin Fee: 0% (Pay at clinic)  Prior Auth: N/A PA# Expiration Date:   # of doses approved:   If patient wants fill through the pharmacy benefit please send prescription to: [facility], and include estimated need by date in rx notes. Pharmacy will ship medication directly to the office.  Patient NOT eligible for Prolia Copay Card. Copay Card can make patient's cost as little as $25. Link to apply: [email or web address]  ** This summary of benefits is an estimation of the patient's out-of-pocket cost. Exact cost may very based on individual plan coverage.

## 2023-08-23 ENCOUNTER — Encounter: Payer: Self-pay | Admitting: Internal Medicine

## 2023-08-29 ENCOUNTER — Ambulatory Visit (INDEPENDENT_AMBULATORY_CARE_PROVIDER_SITE_OTHER)

## 2023-08-29 DIAGNOSIS — M8589 Other specified disorders of bone density and structure, multiple sites: Secondary | ICD-10-CM | POA: Diagnosis not present

## 2023-08-29 MED ORDER — DENOSUMAB 60 MG/ML ~~LOC~~ SOSY: 60.0000 mg | PREFILLED_SYRINGE | Freq: Once | SUBCUTANEOUS | Status: AC

## 2023-08-29 NOTE — Progress Notes (Signed)
 Patient visits today to receive her PROLIA  injection/vaccine. Patient was informed and tolerated well. Patient was notified to reach out to us  if needed.   Patient is cleared for PROLIA  injection on 08/2023 per PA information on 07/18/2023.  Mentioned in provider note last on 03/28/2023. Medication is CLINIC supplied. QMVHQ:$[phone]

## 2023-09-22 NOTE — Telephone Encounter (Signed)
 Injection administered 08/29/23

## 2023-09-27 DIAGNOSIS — L821 Other seborrheic keratosis: Secondary | ICD-10-CM | POA: Diagnosis not present

## 2023-09-27 DIAGNOSIS — D485 Neoplasm of uncertain behavior of skin: Secondary | ICD-10-CM | POA: Diagnosis not present

## 2023-09-27 DIAGNOSIS — D2271 Melanocytic nevi of right lower limb, including hip: Secondary | ICD-10-CM | POA: Diagnosis not present

## 2023-09-27 DIAGNOSIS — L820 Inflamed seborrheic keratosis: Secondary | ICD-10-CM | POA: Diagnosis not present

## 2023-09-27 DIAGNOSIS — L570 Actinic keratosis: Secondary | ICD-10-CM | POA: Diagnosis not present

## 2023-09-27 DIAGNOSIS — D1801 Hemangioma of skin and subcutaneous tissue: Secondary | ICD-10-CM | POA: Diagnosis not present

## 2023-10-18 DIAGNOSIS — H04123 Dry eye syndrome of bilateral lacrimal glands: Secondary | ICD-10-CM | POA: Diagnosis not present

## 2023-10-18 DIAGNOSIS — H40023 Open angle with borderline findings, high risk, bilateral: Secondary | ICD-10-CM | POA: Diagnosis not present

## 2023-10-18 DIAGNOSIS — H2513 Age-related nuclear cataract, bilateral: Secondary | ICD-10-CM | POA: Diagnosis not present

## 2024-01-25 ENCOUNTER — Other Ambulatory Visit (HOSPITAL_BASED_OUTPATIENT_CLINIC_OR_DEPARTMENT_OTHER): Payer: Self-pay

## 2024-01-25 DIAGNOSIS — Z23 Encounter for immunization: Secondary | ICD-10-CM | POA: Diagnosis not present

## 2024-01-25 MED ORDER — COMIRNATY 30 MCG/0.3ML IM SUSY
0.3000 mL | PREFILLED_SYRINGE | Freq: Once | INTRAMUSCULAR | 0 refills | Status: AC
  Filled 2024-01-25: qty 0.3, 1d supply, fill #0

## 2024-01-30 ENCOUNTER — Telehealth: Payer: Self-pay

## 2024-01-30 NOTE — Telephone Encounter (Signed)
 Prolia  VOB initiated via [email or web address]  Next Prolia  inj DUE: 02/28/24

## 2024-01-31 ENCOUNTER — Other Ambulatory Visit (HOSPITAL_COMMUNITY): Payer: Self-pay

## 2024-01-31 NOTE — Telephone Encounter (Signed)
 SABRA

## 2024-01-31 NOTE — Telephone Encounter (Signed)
 Pt ready for scheduling for PROLIA  on or after : 02/28/24  Option# 1: Buy/Bill (Office supplied medication)  Out-of-pocket cost due at time of clinic visit: $0  Number of injection/visits approved: ---  Primary: MEDICARE Prolia  co-insurance: 0% Admin fee co-insurance: 0%  Secondary: [employer] Prolia  co-insurance:  Admin fee co-insurance:   Medical Benefit Details: Date Benefits were checked: 01/30/24 Deductible: $257 Met of $257 Required/ Coinsurance: 0%/ Admin Fee: 0%  Prior Auth: N/A PA# Expiration Date:   # of doses approved: ----------------------------------------------------------------------- Option# 2- Med Obtained from pharmacy:  Pharmacy benefit: Copay $1068.06 (Paid to pharmacy) Admin Fee: 0% (Pay at clinic)  Prior Auth: N/A PA# Expiration Date:   # of doses approved:   If patient wants fill through the pharmacy benefit please send prescription to: [facility], and include estimated need by date in rx notes. Pharmacy will ship medication directly to the office.  Patient NOT eligible for Prolia  Copay Card. Copay Card can make patient's cost as little as $25. Link to apply: [email or web address]  ** This summary of benefits is an estimation of the patient's out-of-pocket cost. Exact cost may very based on individual plan coverage.

## 2024-02-07 ENCOUNTER — Encounter: Payer: Self-pay | Admitting: Internal Medicine

## 2024-02-22 ENCOUNTER — Telehealth: Payer: Self-pay

## 2024-02-22 NOTE — Telephone Encounter (Signed)
 Copied from CRM [id number]. Topic: Clinical - Request for Lab/Test Order >> Feb 22, 2024  9:36 AM Harlene ORN wrote: Reason for CRM: Patient is requesting an appointment for the Prolia  shot

## 2024-02-23 NOTE — Telephone Encounter (Signed)
 Appointment made

## 2024-03-15 ENCOUNTER — Other Ambulatory Visit (HOSPITAL_BASED_OUTPATIENT_CLINIC_OR_DEPARTMENT_OTHER): Payer: Self-pay

## 2024-03-15 DIAGNOSIS — Z23 Encounter for immunization: Secondary | ICD-10-CM | POA: Diagnosis not present

## 2024-03-15 MED ORDER — FLUZONE HIGH-DOSE 0.5 ML IM SUSY
0.5000 mL | PREFILLED_SYRINGE | Freq: Once | INTRAMUSCULAR | 0 refills | Status: AC
  Filled 2024-03-15: qty 0.5, 1d supply, fill #0

## 2024-03-19 ENCOUNTER — Ambulatory Visit

## 2024-03-19 DIAGNOSIS — Z1231 Encounter for screening mammogram for malignant neoplasm of breast: Secondary | ICD-10-CM | POA: Diagnosis not present

## 2024-03-19 LAB — HM MAMMOGRAPHY

## 2024-03-20 ENCOUNTER — Encounter: Payer: Self-pay | Admitting: Internal Medicine

## 2024-03-26 ENCOUNTER — Ambulatory Visit (INDEPENDENT_AMBULATORY_CARE_PROVIDER_SITE_OTHER)

## 2024-03-26 DIAGNOSIS — E538 Deficiency of other specified B group vitamins: Secondary | ICD-10-CM | POA: Diagnosis not present

## 2024-03-26 MED ORDER — CYANOCOBALAMIN 1000 MCG/ML IJ SOLN
1000.0000 ug | Freq: Once | INTRAMUSCULAR | Status: AC
  Administered 2024-03-26: 1000 ug via INTRAMUSCULAR

## 2024-03-26 NOTE — Progress Notes (Signed)
 Pt was B12 injection w/o ay complications at this time.

## 2024-03-27 ENCOUNTER — Ambulatory Visit (INDEPENDENT_AMBULATORY_CARE_PROVIDER_SITE_OTHER)

## 2024-03-27 VITALS — BP 122/70 | HR 65 | Ht 62.0 in | Wt 143.4 lb

## 2024-03-27 DIAGNOSIS — Z0000 Encounter for general adult medical examination without abnormal findings: Secondary | ICD-10-CM

## 2024-03-27 NOTE — Patient Instructions (Addendum)
 Ms. Hutt,  Thank you for taking the time for your Medicare Wellness Visit. I appreciate your continued commitment to your health goals. Please review the care plan we discussed, and feel free to reach out if I can assist you further.  Please note that Annual Wellness Visits do not include a physical exam. Some assessments may be limited, especially if the visit was conducted virtually. If needed, we may recommend an in-person follow-up with your provider.  Ongoing Care Seeing your primary care provider every 3 to 6 months helps us  monitor your health and provide consistent, personalized care.   Referrals If a referral was made during today's visit and you haven't received any updates within two weeks, please contact the referred provider directly to check on the status.  Recommended Screenings:  Health Maintenance  Topic Date Due   COVID-19 Vaccine (7 - 2025-26 season) 07/24/2024   Colon Cancer Screening  08/29/2024   DEXA scan (bone density measurement)  03/13/2025   Medicare Annual Wellness Visit  03/27/2025   DTaP/Tdap/Td vaccine (3 - Td or Tdap) 07/19/2029   Pneumococcal Vaccine for age over 83  Completed   Flu Shot  Completed   Hepatitis C Screening  Completed   Zoster (Shingles) Vaccine  Completed   Meningitis B Vaccine  Aged Out   Breast Cancer Screening  Discontinued       03/27/2024    8:57 AM  Advanced Directives  Does Patient Have a Medical Advance Directive? Yes  Type of Estate Agent of [place];Living will  Does patient want to make changes to medical advance directive? Yes (Inpatient - patient requests chaplain consult to change a medical advance directive)  Copy of Healthcare Power of Attorney in Chart? No - copy requested    Vision: Annual vision screenings are recommended for early detection of glaucoma, cataracts, and diabetic retinopathy. These exams can also reveal signs of chronic conditions such as diabetes and high blood  pressure.  Dental: Annual dental screenings help detect early signs of oral cancer, gum disease, and other conditions linked to overall health, including heart disease and diabetes.

## 2024-03-27 NOTE — Progress Notes (Signed)
 Chief Complaint  Patient presents with   Medicare Wellness     Subjective:   Kelsey Dominguez is a 78 y.o. female who presents for a Medicare Annual Wellness Visit.  Allergies (verified) Patient has no known allergies.   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    Past Surgical History:  Procedure Laterality Date   COLONOSCOPY     LIPOSUCTION HEAD / NECK     chin and neck   ROTATOR CUFF REPAIR Right    TOTAL HIP ARTHROPLASTY Right 06/03/2020   Procedure: TOTAL HIP ARTHROPLASTY ANTERIOR APPROACH;  Surgeon: Ernie Cough, MD;  Location: [facility];  Service: Orthopedics;  Laterality: Right;  70 mins   Family History  Problem Relation Age of Onset   Lung cancer Mother    Alzheimer's disease Father    Heart disease Father    Heart disease Brother        s/p stent   Stroke Maternal Grandfather    Social History   Occupational History   Not on file  Tobacco Use   Smoking status: Former   Smokeless tobacco: Never   Tobacco comments:    smoked 84yrs age 43 to 82  Vaping Use   Vaping status: Never Used  Substance and Sexual Activity   Alcohol use: Yes    Alcohol/week: 2.0 - 4.0 standard drinks of alcohol    Types: 1 Glasses of wine, 1 - 3 Shots of liquor per week    Comment: daily   Drug use: No   Sexual activity: Yes   Tobacco Counseling Counseling given: Not Answered Tobacco comments: smoked 83yrs age 33 to 54  SDOH Screenings   Food Insecurity: No Food Insecurity (03/27/2024)  Housing: Unknown (03/27/2024)  Transportation Needs: No Transportation Needs (03/27/2024)  Utilities: Not At Risk (03/27/2024)  Alcohol Screen: Low Risk  (03/27/2023)  Depression (PHQ2-9): Low Risk  (03/27/2024)  Financial Resource Strain: Low Risk  (03/24/2024)  Physical Activity: Insufficiently Active (03/27/2024)  Social Connections: Socially Integrated (03/27/2024)  Stress: No Stress Concern  Present (03/27/2024)  Tobacco Use: Medium Risk (03/27/2024)  Health Literacy: Adequate Health Literacy (03/27/2024)   See flowsheets for full screening details  Depression Screen PHQ 2 & 9 Depression Scale- Over the past 2 weeks, how often have you been bothered by any of the following problems? Little interest or pleasure in doing things: 0 Feeling down, depressed, or hopeless (PHQ Adolescent also includes...irritable): 0 PHQ-2 Total Score: 0 Trouble falling or staying asleep, or sleeping too much: 0 Feeling tired or having little energy: 0 Poor appetite or overeating (PHQ Adolescent also includes...weight loss): 0 Feeling bad about yourself - or that you are a failure or have let yourself or your family down: 0 Trouble concentrating on things, such as reading the newspaper or watching television (PHQ Adolescent also includes...like school work): 0 Moving or speaking so slowly that other people could have noticed. Or the opposite - being so fidgety or restless that you have been moving around a lot more than usual: 0 Thoughts that you would be better off dead, or of hurting yourself in some way: 0 PHQ-9 Total Score: 0 If you checked off any problems, how difficult have these problems made it for you to do your work, take care of things at home, or get along with other people?: Not difficult at all  Depression Treatment Depression Interventions/Treatment :  PHQ2-9 Score <4 Follow-up Not Indicated     Goals Addressed               This Visit's Progress     Patient Stated (pt-stated)        Patient stated she plans to continue exercising       Visit info / Clinical Intake: Medicare Wellness Visit Type:: Subsequent Annual Wellness Visit Persons participating in visit:: patient Medicare Wellness Visit Mode:: In-person (required for WTM) Information given by:: patient Interpreter Needed?: No Pre-visit prep was completed: yes AWV questionnaire completed by patient prior to  visit?: yes Date:: 03/24/24 Living arrangements:: lives with spouse/significant other Patient's Overall Health Status Rating: good Typical amount of pain: none Does pain affect daily life?: no Are you currently prescribed opioids?: no  Dietary Habits and Nutritional Risks How many meals a day?: 3 Eats fruit and vegetables daily?: yes Most meals are obtained by: preparing own meals; eating out Diabetic:: no  Functional Status Activities of Daily Living (to include ambulation/medication): Independent Ambulation: Independent with device- listed below Home Assistive Devices/Equipment: Eyeglasses Medication Administration: Independent Home Management: Independent Manage your own finances?: yes Primary transportation is: driving Concerns about vision?: no *vision screening is required for WTM* Concerns about hearing?: no  Fall Screening Falls in the past year?: 0 Number of falls in past year: 0 Was there an injury with Fall?: 0 Fall Risk Category Calculator: 0 Patient Fall Risk Level: Low Fall Risk  Fall Risk Patient at Risk for Falls Due to: No Fall Risks Fall risk Follow up: Falls evaluation completed; Falls prevention discussed  Home and Transportation Safety: All rugs have non-skid backing?: yes All stairs or steps have railings?: N/A, no stairs Grab bars in the bathtub or shower?: yes Have non-skid surface in bathtub or shower?: yes Good home lighting?: yes Regular seat belt use?: yes Hospital stays in the last year:: no  Cognitive Assessment Difficulty concentrating, remembering, or making decisions? : no Will 6CIT or Mini Cog be Completed: yes What year is it?: 0 points What month is it?: 0 points Give patient an address phrase to remember (5 components): [address] [place], [place] About what time is it?: 0 points Count backwards from 20 to 1: 0 points Say the months of the year in reverse: 0 points Repeat the address phrase from earlier: 0 points 6 CIT Score:  0 points  Advance Directives (For Healthcare) Does Patient Have a Medical Advance Directive?: Yes Does patient want to make changes to medical advance directive?: Yes (Inpatient - patient requests chaplain consult to change a medical advance directive) Type of Advance Directive: Healthcare Power of [place]; Living will Copy of Healthcare Power of Attorney in Chart?: No - copy requested Copy of Living Will in Chart?: No - copy requested  Reviewed/Updated  Reviewed/Updated: Reviewed All (Medical, Surgical, Family, Medications, Allergies, Care Teams, Patient Goals)        Objective:    Today's Vitals   03/27/24 0856  BP: 122/70  Pulse: 65  SpO2: 97%  Weight: 143 lb 6.4 oz (65 kg)  Height: 5' 2 (1.575 m)   Body mass index is 26.23 kg/m.  Current Medications (verified) Outpatient Encounter Medications as of 03/27/2024  Medication Sig   calcium-vitamin D  (OSCAL WITH D) 500-200 MG-UNIT tablet Take 1 tablet by mouth daily.   Cholecalciferol (VITAMIN D3) 2000 units TABS Take 4,000 Units by mouth daily.    Multiple Vitamin (MULTI-VITAMIN DAILY PO)    pneumococcal 21-valent conjugate vaccine (CAPVAXIVE ) 0.5  ML injection Inject into the muscle.   TURMERIC PO Take 1,000 mg by mouth daily.   vitamin B-12 (CYANOCOBALAMIN ) 1000 MCG tablet Take 1,000 mcg by mouth daily.   Facility-Administered Encounter Medications as of 03/27/2024  Medication   denosumab  (PROLIA ) injection 60 mg   Hearing/Vision screen Hearing Screening - Comments:: Denies hearing difficulties   Vision Screening - Comments:: Wears rx glasses - up to date with routine eye exams with Ozell Bertin Immunizations and Health Maintenance Health Maintenance  Topic Date Due   COVID-19 Vaccine (7 - 2025-26 season) 07/24/2024   Colonoscopy  08/29/2024   DEXA SCAN  03/13/2025   Medicare Annual Wellness (AWV)  03/27/2025   DTaP/Tdap/Td (3 - Td or Tdap) 07/19/2029   Pneumococcal Vaccine: 50+ Years  Completed   Influenza  Vaccine  Completed   Hepatitis C Screening  Completed   Zoster Vaccines- Shingrix  Completed   Meningococcal B Vaccine  Aged Out   Mammogram  Discontinued        Assessment/Plan:  This is a routine wellness examination for Kelsey Dominguez.  Patient Care Team: Geofm Glade PARAS, MD as PCP - General (Internal Medicine)  I have personally reviewed and noted the following in the patient's chart:   Medical and social history Use of alcohol, tobacco or illicit drugs  Current medications and supplements including opioid prescriptions. Functional ability and status Nutritional status Physical activity Advanced directives List of other physicians Hospitalizations, surgeries, and ER visits in previous 12 months Vitals Screenings to include cognitive, depression, and falls Referrals and appointments  No orders of the defined types were placed in this encounter.  In addition, I have reviewed and discussed with patient certain preventive protocols, quality metrics, and best practice recommendations. A written personalized care plan for preventive services as well as general preventive health recommendations were provided to patient.   Verdie CHRISTELLA Saba, CMA   03/27/2024   Return in 1 year (on 03/27/2025).  After Visit Summary: (In Person-Declined) Patient declined AVS at this time.  Nurse Notes: Pt will schedule a CPE w/PCP today.

## 2024-04-25 DIAGNOSIS — H04123 Dry eye syndrome of bilateral lacrimal glands: Secondary | ICD-10-CM | POA: Diagnosis not present

## 2024-04-25 DIAGNOSIS — H43813 Vitreous degeneration, bilateral: Secondary | ICD-10-CM | POA: Diagnosis not present

## 2024-04-25 DIAGNOSIS — H40023 Open angle with borderline findings, high risk, bilateral: Secondary | ICD-10-CM | POA: Diagnosis not present

## 2024-04-25 DIAGNOSIS — H25013 Cortical age-related cataract, bilateral: Secondary | ICD-10-CM | POA: Diagnosis not present

## 2024-04-25 DIAGNOSIS — H2513 Age-related nuclear cataract, bilateral: Secondary | ICD-10-CM | POA: Diagnosis not present

## 2024-06-29 ENCOUNTER — Encounter: Admitting: Internal Medicine

## 2025-03-28 ENCOUNTER — Ambulatory Visit
